# Patient Record
Sex: Male | Born: 1990 | Hispanic: No | Marital: Single | Smoking: Never smoker
Health system: Southern US, Community
[De-identification: ages and names within clinical notes are randomized; demographics above are authoritative.]

## PROBLEM LIST (undated history)

## (undated) DIAGNOSIS — S060X9A Concussion with loss of consciousness of unspecified duration, initial encounter: Secondary | ICD-10-CM

## (undated) DIAGNOSIS — Z789 Other specified health status: Secondary | ICD-10-CM

## (undated) HISTORY — PX: TONSILLECTOMY: SUR1361

## (undated) HISTORY — PX: EYE SURGERY: SHX253

---
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
## 2015-01-07 ENCOUNTER — Ambulatory Visit (INDEPENDENT_AMBULATORY_CARE_PROVIDER_SITE_OTHER): Payer: Self-pay | Admitting: Emergency Medicine

## 2015-01-07 VITALS — BP 100/60 | HR 120 | Temp 100.8°F | Resp 18 | Ht 71.0 in | Wt 180.0 lb

## 2015-01-07 DIAGNOSIS — B359 Dermatophytosis, unspecified: Secondary | ICD-10-CM

## 2015-01-07 DIAGNOSIS — J0140 Acute pansinusitis, unspecified: Secondary | ICD-10-CM

## 2015-01-07 DIAGNOSIS — J209 Acute bronchitis, unspecified: Secondary | ICD-10-CM

## 2015-01-07 MED ORDER — PSEUDOEPHEDRINE-GUAIFENESIN ER 60-600 MG PO TB12: 1.0000 | ORAL_TABLET | Freq: Two times a day (BID) | ORAL | Status: AC

## 2015-01-07 MED ORDER — CLOTRIMAZOLE 1 % EX CREA: 1.0000 "application " | TOPICAL_CREAM | Freq: Two times a day (BID) | CUTANEOUS | Status: DC

## 2015-01-07 MED ORDER — AMOXICILLIN-POT CLAVULANATE 875-125 MG PO TABS: 1.0000 | ORAL_TABLET | Freq: Two times a day (BID) | ORAL | Status: DC

## 2015-01-07 MED ORDER — PROMETHAZINE-CODEINE 6.25-10 MG/5ML PO SYRP: 5.0000 mL | ORAL_SOLUTION | Freq: Four times a day (QID) | ORAL | Status: DC | PRN

## 2015-01-07 NOTE — Patient Instructions (Signed)

## 2015-01-07 NOTE — Progress Notes (Signed)
Subjective:  Patient ID: Xavier Dorsey, male    DOB: 01/08/1991  Age: 24 y.o. MRN: [id number]  CC: Sore Throat; Headache; Cough; Fever; and Chills   HPI Xavier Dorsey presents  patient has nasal congestion postnasal drainage and nasal discharge is purulent. He has a headache and pressure in his cheeks and forehead. He has a sore throat fever. He has no chills. Has no nausea vomiting. Nonproductive cough with no wheezing or shortness of breath. He also noticed a rash on his right shoulder. He's had no improvement with over-the-counter medication he denies any sick contacts  History Xavier Dorsey has no past medical history on file.   He has past surgical history that includes Eye surgery and Tonsillectomy.   His  family history is not on file.  He   reports that he has never smoked. He does not have any smokeless tobacco history on file. He reports that he does not drink alcohol or use illicit drugs.  No outpatient prescriptions prior to visit.   No facility-administered medications prior to visit.    Social History   Social History  . Marital Status: Single    Spouse Name: N/A  . Number of Children: N/A  . Years of Education: N/A   Social History Main Topics  . Smoking status: Never Smoker   . Smokeless tobacco: None  . Alcohol Use: No  . Drug Use: No  . Sexual Activity: Not Asked   Other Topics Concern  . None   Social History Narrative  . None     Review of Systems  Constitutional: Positive for fever, chills and fatigue. Negative for appetite change.  HENT: Positive for congestion, postnasal drip, rhinorrhea, sinus pressure and sore throat. Negative for ear pain.   Eyes: Negative for pain and redness.  Respiratory: Positive for cough. Negative for shortness of breath and wheezing.   Cardiovascular: Negative for leg swelling.  Gastrointestinal: Negative for nausea, vomiting, abdominal pain, diarrhea, constipation and blood in stool.  Endocrine: Negative for  polyuria.  Genitourinary: Negative for dysuria, urgency, frequency and flank pain.  Musculoskeletal: Negative for gait problem.  Skin: Negative for rash.  Neurological: Negative for weakness and headaches.  Psychiatric/Behavioral: Negative for confusion and decreased concentration. The patient is not nervous/anxious.     Objective:  BP 100/60 mmHg  Pulse 120  Temp(Src) 100.8 F (38.2 C) (Oral)  Resp 18  Ht 5\' 11"  (1.803 m)  Wt 180 lb (81.647 kg)  BMI 25.12 kg/m2  SpO2 99%  Physical Exam  Constitutional: He is oriented to person, place, and time. He appears well-developed and well-nourished. No distress.  HENT:  Head: Normocephalic and atraumatic.  Right Ear: External ear normal.  Left Ear: External ear normal.  Nose: Nose normal.  Eyes: Conjunctivae and EOM are normal. Pupils are equal, round, and reactive to light. No scleral icterus.  Neck: Normal range of motion. Neck supple. No tracheal deviation present.  Cardiovascular: Normal rate, regular rhythm and normal heart sounds.   Pulmonary/Chest: Effort normal. No respiratory distress. He has no wheezes. He has no rales.  Abdominal: He exhibits no mass. There is no tenderness. There is no rebound and no guarding.  Musculoskeletal: He exhibits no edema.  Lymphadenopathy:    He has no cervical adenopathy.  Neurological: He is alert and oriented to person, place, and time. Coordination normal.  Skin: Skin is warm and dry. No rash noted.  Psychiatric: He has a normal mood and affect. His behavior is normal.  Assessment & Plan:   Xavier Dorsey was seen today for sore throat, headache, cough, fever and chills.  Diagnoses and all orders for this visit:  Acute bronchitis, unspecified organism  Acute pansinusitis, recurrence not specified  Ringworm  Other orders -     amoxicillin-clavulanate (AUGMENTIN) 875-125 MG tablet; Take 1 tablet by mouth 2 (two) times daily. -     pseudoephedrine-guaifenesin (MUCINEX D) 60-600 MG 12 hr  tablet; Take 1 tablet by mouth every 12 (twelve) hours. -     promethazine-codeine (PHENERGAN WITH CODEINE) 6.25-10 MG/5ML syrup; Take 5-10 mLs by mouth every 6 (six) hours as needed. -     clotrimazole (LOTRIMIN) 1 % cream; Apply 1 application topically 2 (two) times daily.  I am having Mr. Casamento start on amoxicillin-clavulanate, pseudoephedrine-guaifenesin, promethazine-codeine, and clotrimazole.  Meds ordered this encounter  Medications  . amoxicillin-clavulanate (AUGMENTIN) 875-125 MG tablet    Sig: Take 1 tablet by mouth 2 (two) times daily.    Dispense:  20 tablet    Refill:  0  . pseudoephedrine-guaifenesin (MUCINEX D) 60-600 MG 12 hr tablet    Sig: Take 1 tablet by mouth every 12 (twelve) hours.    Dispense:  18 tablet    Refill:  0  . promethazine-codeine (PHENERGAN WITH CODEINE) 6.25-10 MG/5ML syrup    Sig: Take 5-10 mLs by mouth every 6 (six) hours as needed.    Dispense:  120 mL    Refill:  0  . clotrimazole (LOTRIMIN) 1 % cream    Sig: Apply 1 application topically 2 (two) times daily.    Dispense:  30 g    Refill:  0    Appropriate red flag conditions were discussed with the patient as well as actions that should be taken.  Patient expressed his understanding.  Follow-up: Return if symptoms worsen or fail to improve.  Carmelina Dane, MD

## 2015-01-09 ENCOUNTER — Emergency Department (HOSPITAL_COMMUNITY)
Admission: EM | Admit: 2015-01-09 | Discharge: 2015-01-09 | Disposition: A | Discharge status: Home / Self Care | Payer: Self-pay | Attending: Emergency Medicine | Admitting: Emergency Medicine

## 2015-01-09 ENCOUNTER — Encounter (HOSPITAL_COMMUNITY): Payer: Self-pay | Admitting: *Deleted

## 2015-01-09 DIAGNOSIS — J029 Acute pharyngitis, unspecified: Secondary | ICD-10-CM

## 2015-01-09 DIAGNOSIS — K122 Cellulitis and abscess of mouth: Secondary | ICD-10-CM

## 2015-01-09 DIAGNOSIS — Z79899 Other long term (current) drug therapy: Secondary | ICD-10-CM | POA: Insufficient documentation

## 2015-01-09 DIAGNOSIS — Z9889 Other specified postprocedural states: Secondary | ICD-10-CM | POA: Insufficient documentation

## 2015-01-09 DIAGNOSIS — M791 Myalgia: Secondary | ICD-10-CM | POA: Insufficient documentation

## 2015-01-09 LAB — RAPID STREP SCREEN (MED CTR MEBANE ONLY): STREPTOCOCCUS, GROUP A SCREEN (DIRECT): NEGATIVE

## 2015-01-09 MED ORDER — DEXAMETHASONE SODIUM PHOSPHATE 10 MG/ML IJ SOLN
10.0000 mg | Freq: Once | INTRAMUSCULAR | Status: AC
  Administered 2015-01-09: 10 mg via INTRAMUSCULAR
  Filled 2015-01-09: qty 1

## 2015-01-09 MED ORDER — ACETAMINOPHEN 325 MG PO TABS
650.0000 mg | ORAL_TABLET | Freq: Once | ORAL | Status: AC
  Administered 2015-01-09: 650 mg via ORAL
  Filled 2015-01-09: qty 2

## 2015-01-09 MED ORDER — HYDROCODONE-ACETAMINOPHEN 7.5-325 MG/15ML PO SOLN: 15.0000 mL | Freq: Four times a day (QID) | ORAL | Status: AC | PRN

## 2015-01-09 NOTE — ED Provider Notes (Signed)
CSN: [id number]     Arrival date & time 01/09/15  1110 History  By signing my name below, I, Xavier Dorsey, attest that this documentation has been prepared under the direction and in the presence of non-physician practitioner, Jaynie Crumbleatyana Mairi Stagliano, PA-C. Electronically Signed: [name], Scribe. 01/09/2015. 12:20 PM.  Chief Complaint  Patient presents with  . Sore Throat   The history is provided by the patient. No language interpreter was used.    HPI Comments:  Xavier Dorsey is a 24 y.o. male who presents to the Emergency Department complaining of a  sore throat for ~ 4 days. He rates his pain a 6/10 and is tolerating secretions. Pt reports associated body aches, HA and fever. He reports a max temp of 103 4 days ago; no fever reducer taken. Pt states he was evaluated at urgent care 3 days ago and was given augmentin which he has been taking since. He was also given mucinex. Pt denies rhinorrhea and vomiting.   History reviewed. No pertinent past medical history. Past Surgical History  Procedure Laterality Date  . Eye surgery    . Tonsillectomy     History reviewed. No pertinent family history. Social History  Substance Use Topics  . Smoking status: Never Smoker   . Smokeless tobacco: None  . Alcohol Use: No    Review of Systems  Constitutional: Positive for fever. Negative for chills.  HENT: Positive for sore throat.   Respiratory: Negative for shortness of breath.   Cardiovascular: Negative for chest pain.  Musculoskeletal: Positive for myalgias.    Allergies  Review of patient's allergies indicates no known allergies.  Home Medications   Prior to Admission medications   Medication Sig Start Date End Date Taking? Authorizing Provider  amoxicillin-clavulanate (AUGMENTIN) 875-125 MG tablet Take 1 tablet by mouth 2 (two) times daily. 01/07/15   Carmelina DaneJeffery S Anderson, MD  clotrimazole (LOTRIMIN) 1 % cream Apply 1 application topically 2 (two) times daily. 01/07/15   Carmelina DaneJeffery  S Anderson, MD  promethazine-codeine (PHENERGAN WITH CODEINE) 6.25-10 MG/5ML syrup Take 5-10 mLs by mouth every 6 (six) hours as needed. 01/07/15   Carmelina DaneJeffery S Anderson, MD  pseudoephedrine-guaifenesin (MUCINEX D) 60-600 MG 12 hr tablet Take 1 tablet by mouth every 12 (twelve) hours. 01/07/15 01/07/16  Carmelina DaneJeffery S Anderson, MD   BP 112/58 mmHg  Pulse 104  Temp(Src) 98.6 F (37 C) (Oral)  Resp 16  SpO2 92% Physical Exam  Constitutional: He is oriented to person, place, and time. He appears well-developed and well-nourished. No distress.  HENT:  Head: Normocephalic and atraumatic.  Right Ear: Tympanic membrane, external ear and ear canal normal.  Left Ear: Tympanic membrane, external ear and ear canal normal.  Nose: Nose normal.  Mouth/Throat: Oropharyngeal exudate and posterior oropharyngeal erythema present.  Eyes: Conjunctivae are normal.  Cardiovascular: Normal rate, regular rhythm and normal heart sounds.   Pulmonary/Chest: Effort normal and breath sounds normal. No respiratory distress. He has no wheezes. He has no rales.  Abdominal: He exhibits no distension.  Neurological: He is alert and oriented to person, place, and time.  Skin: Skin is warm and dry.  Psychiatric: He has a normal mood and affect.  Nursing note and vitals reviewed.   ED Course  Procedures   DIAGNOSTIC STUDIES:  Oxygen Saturation is 99% on RA, normal by my interpretation.    COORDINATION OF CARE:  12:19 PM Will order rapid strep test.  Discussed treatment plan with pt at bedside and pt agreed to plan.  Labs Review Labs Reviewed  RAPID STREP SCREEN (NOT AT [facility])  CULTURE, GROUP A STREP    Imaging Review No results found. I have personally reviewed and evaluated these lab results as part of my medical decision-making.   MDM   Final diagnoses:  Pharyngitis  Uvulitis    patient symptoms emergency department complaining of fever, sore throat, body aches. Patient was seen 2 days ago, started on  Augmentin. He is currently taking that. Has prescription for 10 days. We ran a strep screen, which came back negative. Patient does have exudative pharyngitis and uvulitis. Given Decadron 10 mg IM for swelling. Home with salt water gargles, continue Augmentin, ibuprofen and Tylenol for pain. Will give Lortab for severe pain since patient states is very painful to swallow and feels like he could be dehydrated and at times reported dizziness. Instructed to drink plain fluids. Follow-up as needed  Filed Vitals:   01/09/15 1122  BP: 112/58  Pulse: 104  Temp: 98.6 F (37 C)  TempSrc: Oral  Resp: 16  SpO2: 92%    I personally performed the services described in this documentation, which was scribed in my presence. The recorded information has been reviewed and is accurate.   Jaynie Crumble, PA-C 01/09/15 1706  Gerhard Munch, MD 01/10/15 1209

## 2015-01-09 NOTE — Discharge Instructions (Signed)
Ibuprofen for pain and fever. Hycet for severe pain only. Drink plenty of fluids. Continue to take antibiotics, take until all gone. Salt water gargles several times a day. Follow up with urgent care or return if worsening symptoms.   Pharyngitis Pharyngitis is redness, pain, and swelling (inflammation) of your pharynx.  CAUSES  Pharyngitis is usually caused by infection. Most of the time, these infections are from viruses (viral) and are part of a cold. However, sometimes pharyngitis is caused by bacteria (bacterial). Pharyngitis can also be caused by allergies. Viral pharyngitis may be spread from person to person by coughing, sneezing, and personal items or utensils (cups, forks, spoons, toothbrushes). Bacterial pharyngitis may be spread from person to person by more intimate contact, such as kissing.  SIGNS AND SYMPTOMS  Symptoms of pharyngitis include:   Sore throat.   Tiredness (fatigue).   Low-grade fever.   Headache.  Joint pain and muscle aches.  Skin rashes.  Swollen lymph nodes.  Plaque-like film on throat or tonsils (often seen with bacterial pharyngitis). DIAGNOSIS  Your health care provider will ask you questions about your illness and your symptoms. Your medical history, along with a physical exam, is often all that is needed to diagnose pharyngitis. Sometimes, a rapid strep test is done. Other lab tests may also be done, depending on the suspected cause.  TREATMENT  Viral pharyngitis will usually get better in 3-4 days without the use of medicine. Bacterial pharyngitis is treated with medicines that kill germs (antibiotics).  HOME CARE INSTRUCTIONS   Drink enough water and fluids to keep your urine clear or pale yellow.   Only take over-the-counter or prescription medicines as directed by your health care provider:   If you are prescribed antibiotics, make sure you finish them even if you start to feel better.   Do not take aspirin.   Get lots of rest.    Gargle with 8 oz of salt water ( tsp of salt per 1 qt of water) as often as every 1-2 hours to soothe your throat.   Throat lozenges (if you are not at risk for choking) or sprays may be used to soothe your throat. SEEK MEDICAL CARE IF:   You have large, tender lumps in your neck.  You have a rash.  You cough up green, yellow-brown, or bloody spit. SEEK IMMEDIATE MEDICAL CARE IF:   Your neck becomes stiff.  You drool or are unable to swallow liquids.  You vomit or are unable to keep medicines or liquids down.  You have severe pain that does not go away with the use of recommended medicines.  You have trouble breathing (not caused by a stuffy nose). MAKE SURE YOU:   Understand these instructions.  Will watch your condition.  Will get help right away if you are not doing well or get worse.   This information is not intended to replace advice given to you by your health care provider. Make sure you discuss any questions you have with your health care provider.   Document Released: 01/31/2005 Document Revised: 11/21/2012 Document Reviewed: 10/08/2012 Else[employer] Interactive Patient Education [employer]2016 [employer]

## 2015-01-09 NOTE — ED Notes (Signed)
Pt c/o sore throat and fever x 4 days, also sts gen.body aches.

## 2015-01-11 LAB — CULTURE, GROUP A STREP: STREP A CULTURE: NEGATIVE

## 2015-01-14 ENCOUNTER — Emergency Department (HOSPITAL_COMMUNITY)
Admission: EM | Admit: 2015-01-14 | Discharge: 2015-01-14 | Disposition: A | Discharge status: Home / Self Care | Payer: Self-pay | Attending: Emergency Medicine | Admitting: Emergency Medicine

## 2015-01-14 ENCOUNTER — Encounter (HOSPITAL_COMMUNITY): Payer: Self-pay

## 2015-01-14 DIAGNOSIS — R531 Weakness: Secondary | ICD-10-CM | POA: Insufficient documentation

## 2015-01-14 DIAGNOSIS — Z792 Long term (current) use of antibiotics: Secondary | ICD-10-CM | POA: Insufficient documentation

## 2015-01-14 DIAGNOSIS — R630 Anorexia: Secondary | ICD-10-CM | POA: Insufficient documentation

## 2015-01-14 DIAGNOSIS — D696 Thrombocytopenia, unspecified: Secondary | ICD-10-CM | POA: Insufficient documentation

## 2015-01-14 DIAGNOSIS — R110 Nausea: Secondary | ICD-10-CM | POA: Insufficient documentation

## 2015-01-14 DIAGNOSIS — R197 Diarrhea, unspecified: Secondary | ICD-10-CM | POA: Insufficient documentation

## 2015-01-14 DIAGNOSIS — Z79899 Other long term (current) drug therapy: Secondary | ICD-10-CM | POA: Insufficient documentation

## 2015-01-14 LAB — BASIC METABOLIC PANEL
Anion gap: 7 (ref 5–15)
BUN: 11 mg/dL (ref 6–20)
CHLORIDE: 105 mmol/L (ref 101–111)
CO2: 24 mmol/L (ref 22–32)
Calcium: 9 mg/dL (ref 8.9–10.3)
Creatinine, Ser: 0.62 mg/dL (ref 0.61–1.24)
GFR calc Af Amer: >60 mL/min (ref >60)
GLUCOSE: 114 mg/dL — AB (ref 65–99)
POTASSIUM: 4.8 mmol/L (ref 3.5–5.1)
Sodium: 136 mmol/L (ref 135–145)

## 2015-01-14 LAB — CBC WITH DIFFERENTIAL/PLATELET
BASOS PCT: 4 %
Basophils Absolute: 0.1 10*3/uL (ref 0.0–0.1)
EOS PCT: 0 %
Eosinophils Absolute: 0 10*3/uL (ref 0.0–0.7)
HCT: 44.8 % (ref 39.0–52.0)
Hemoglobin: 15.8 g/dL (ref 13.0–17.0)
Lymphocytes Relative: 22 %
Lymphs Abs: 0.8 10*3/uL (ref 0.7–4.0)
MCH: 29.8 pg (ref 26.0–34.0)
MCHC: 35.3 g/dL (ref 30.0–36.0)
MCV: 84.4 fL (ref 78.0–100.0)
MONO ABS: 0.5 10*3/uL (ref 0.1–1.0)
Monocytes Relative: 14 %
NEUTROS PCT: 60 %
Neutro Abs: 2.2 10*3/uL (ref 1.7–7.7)
PLATELETS: 98 10*3/uL — AB (ref 150–400)
RBC: 5.31 MIL/uL (ref 4.22–5.81)
RDW: 12.4 % (ref 11.5–15.5)
WBC: 3.6 10*3/uL — ABNORMAL LOW (ref 4.0–10.5)

## 2015-01-14 MED ORDER — ONDANSETRON HCL 4 MG PO TABS: 4.0000 mg | ORAL_TABLET | Freq: Four times a day (QID) | ORAL | Status: DC

## 2015-01-14 MED ORDER — SODIUM CHLORIDE 0.9 % IV BOLUS (SEPSIS)
1000.0000 mL | Freq: Once | INTRAVENOUS | Status: AC
  Administered 2015-01-14: 1000 mL via INTRAVENOUS

## 2015-01-14 MED ORDER — ONDANSETRON HCL 4 MG/2ML IJ SOLN
4.0000 mg | Freq: Once | INTRAMUSCULAR | Status: AC
  Administered 2015-01-14: 4 mg via INTRAVENOUS
  Filled 2015-01-14: qty 2

## 2015-01-14 NOTE — Discharge Instructions (Signed)
Diarrhea Follow up with a provider using the resource guide below. Take Zofran for nausea. Stay well-hydrated. Diarrhea is watery poop (stool). It can make you feel weak, tired, thirsty, or give you a dry mouth (signs of dehydration). Watery poop is a sign of another problem, most often an infection. It often lasts 2-3 days. It can last longer if it is a sign of something serious. Take care of yourself as told by your doctor. HOME CARE   Drink 1 cup (8 ounces) of fluid each time you have watery poop.  Do not drink the following fluids:  Those that contain simple sugars (fructose, glucose, galactose, lactose, sucrose, maltose).  Sports drinks.  Fruit juices.  Whole milk products.  Sodas.  Drinks with caffeine (coffee, tea, soda) or alcohol.  Oral rehydration solution may be used if the doctor says it is okay. You may make your own solution. Follow this recipe:   - teaspoon table salt.   teaspoon baking soda.   teaspoon salt substitute containing potassium chloride.  1 tablespoons sugar.  1 liter (34 ounces) of water.  Avoid the following foods:  High fiber foods, such as raw fruits and vegetables.  Nuts, seeds, and whole grain breads and cereals.   Those that are sweetened with sugar alcohols (xylitol, sorbitol, mannitol).  Try eating the following foods:  Starchy foods, such as rice, toast, pasta, low-sugar cereal, oatmeal, baked potatoes, crackers, and bagels.  Bananas.  Applesauce.  Eat probiotic-rich foods, such as yogurt and milk products that are fermented.  Wash your hands well after each time you have watery poop.  Only take medicine as told by your doctor.  Take a warm bath to help lessen burning or pain from having watery poop. GET HELP RIGHT AWAY IF:   You cannot drink fluids without throwing up (vomiting).  You keep throwing up.  You have blood in your poop, or your poop looks black and tarry.  You do not pee (urinate) in 6-8 hours, or there  is only a small amount of very dark pee.  You have belly (abdominal) pain that gets worse or stays in the same spot (localizes).  You are weak, dizzy, confused, or light-headed.  You have a very bad headache.  Your watery poop gets worse or does not get better.  You have a fever or lasting symptoms for more than 2-3 days.  You have a fever and your symptoms suddenly get worse. MAKE SURE YOU:   Understand these instructions.  Will watch your condition.  Will get help right away if you are not doing well or get worse.   This information is not intended to replace advice given to you by your health care provider. Make sure you discuss any questions you have with your health care provider.   Document Released: 07/20/2007 Document Revised: 02/21/2014 Document Reviewed: 10/09/2011 Elsevier Interactive Patient Education 2016 [employer].  Emergency Department Resource Guide 1) Find a Doctor and Pay Out of Pocket Although you won't have to find out who is covered by your insurance plan, it is a good idea to ask around and get recommendations. You will then need to call the office and see if the doctor you have chosen will accept you as a new patient and what types of options they offer for patients who are self-pay. Some doctors offer discounts or will set up payment plans for their patients who do not have insurance, but you will need to ask so you aren't surprised when you  get to your appointment.  2) Contact Your Local Health Department Not all health departments have doctors that can see patients for sick visits, but many do, so it is worth a call to see if yours does. If you don't know where your local health department is, you can check in your phone book. The CDC also has a tool to help you locate your state's health department, and many state websites also have listings of all of their local health departments.  3) Find a Walk-in Clinic If your illness is not likely to be very  severe or complicated, you may want to try a walk in clinic. These are popping up all over the country in pharmacies, drugstores, and shopping centers. They're usually staffed by nurse practitioners or physician assistants that have been trained to treat common illnesses and complaints. They're usually fairly quick and inexpensive. However, if you have serious medical issues or chronic medical problems, these are probably not your best option.  No Primary Care Doctor: - Call Health Connect at  [phone] - they can help you locate a primary care doctor that  accepts your insurance, provides certain services, etc. - Physician Referral Service- [phone]  Chronic Pain Problems: Organization         Address  Phone   Notes  [facility]  [phone] Patients need to be referred by their primary care doctor.   Medication Assistance: Organization         Address  Phone   Notes  [facility] [facility] [address] [place], [place] [address] [phone]) [phone] --Must be a resident of [place] -- Must have NO insurance coverage whatsoever (no Medicaid/ Medicare, etc.) -- The pt. MUST have a primary care doctor that directs their care regularly and follows them in the community   [facility]ist  [phone]   [employer]  [phone]) [phone]    Agencies that provide inexpensive medical care: Organization         Address  Phone   Notes  [facility]  [phone]   [facility]    [phone]   [facility] [address] [place], [place] [address] [phone]   [facility] [address], [place] [phone]   Planned Parenthood    [phone]   [facility]    [phone](336) [phone]   [facility]  [address], [place] Phone:  [phone], Fax:  [phone] Hours of Operation:  9 am - 6 pm, M-F.  Also accepts  Medicaid/Medicare and self-pay.  [facility]  [address], [place] Phone: [phone](336) [phone], Fax: [phone]) [phone]. Hours of Operation:  8:30 am - 5:30 pm, M-F.  Also accepts Medicaid and self-pay.  [facility] [place] [address], [place] Phone: [phone]) [phone]   [facility] [address], [place], [place] [phone], Ext. [phone] Mondays & Thursdays: 7-9 AM.  First 15 patients are seen on a first come, first serve basis.    Medicaid-accepting [facility] Providers:  Organization         Address  Phone   Notes  [facility] [address], [place] [phone] Also accepts self-pay patients.  [facility] [address] [place], [place]  [phone]) [phone]   [facility]  [address], [place] [phone]   [facility] [address] [place] [phone]   Renaye Rakers [address], [place]   [phone] Only accepts [place] Access [place] patients after they have their name applied to their card.   Self-Pay (no insurance) in [facility]:  Organization         Address  Phone   Notes  Sickle Cell Patients, [facility] [address] [place], [place] [phone]   [facility] [address] [place], [place] [phone]   [facility] [place]  [address], [place] [phone]   [facility]/Dr. Osei-Bonsu  [address] [place] or [address], [place] [phone] Phone number for both [place] and [place] locations is the same.  [facility] [address], [place] [phone]   [facility] [address] [place] or [address] [phone]   [facility] [address] [place] [phone], phone; [phone], fax Sees patients 1st and 3rd Saturday of every month.  Must not qualify for public or private insurance (i.e. Medicaid, Medicare, [place] Health Choice, Veterans' Benefits)  Household income should be no more than 200% of the poverty level The clinic cannot treat you if you are pregnant or think you are pregnant  Sexually transmitted diseases are not treated at the clinic.    Dental Care: Organization         Address  Phone  Notes  [facility] [address] [place], [place] [phone] Accepts children up to age 72 who are enrolled in [place] or [place] Health Choice; pregnant women with a Medicaid card; and children who have applied for Medicaid or [place] Health Choice, but were declined, whose parents can pay a reduced fee at time of service.  [facility]  [address], [place] [phone] Accepts children up to age 71 who are enrolled in [place] or [place] Health Choice; pregnant women with a Medicaid card; and children who have applied for Medicaid or [place] Health Choice, but were declined, whose parents can pay a reduced fee at time of service.  [facility]  [address] [place], [place] [phone] Patients are seen by appointment only. Walk-ins are not accepted. [facility] will see patients 31 years of age and older. Monday - Tuesday (8am-5pm) Most Wednesdays (8:30-5pm) $30 per visit, cash only  [facility] Access [facility]  [address], [facility] [phone] Patients are seen by appointment only. Walk-ins are not accepted. [facility] will see patients 43 years of age and older. One Wednesday Evening (Monthly: Volunteer Based).  $30 per visit, cash only  [employer]  [phone] for adults; Children under age 48, call Graduate Pediatric Dentistry at [phone]. Children aged  61-14, please call [phone] to request a pediatric application.  Dental services are provided in all areas of dental care including fillings, crowns and bridges, complete and partial dentures, implants, gum treatment, root canals, and extractions. Preventive care is also provided. Treatment is provided to both adults and children. Patients are selected via a lottery and there is often a waiting list.   [facility] [address] [place]  [phone] [email or web address]   [facility] [address] [place], [place] [phone], Ext. [phone] Second and Fourth Thursday of each month, opens at 6:30 AM; Clinic ends at 9 AM.  Patients are seen on a first-come first-served basis, and a limited number are seen during each clinic.   [facility]  [address] [place] [place], [place] [phone]   Eligibility Requirements You must have lived in [place], [place], or [place] counties for at least the last three months.   You cannot be eligible for state or federal sponsored [place], including [employer], [place], or [employer].   You generally cannot be eligible for healthcare insurance through your employer.    How to apply: Eligibility screenings are held every Tuesday and Wednesday afternoon from 1:00 pm until 4:00 pm. You do not need an appointment for the interview!  [facility] [address] [place], [place] [phone]   [facility] Health Department  [phone]   [facility] Health Department  [phone]   [facility] Health Department  [phone]    Behavioral Health Resources in the Community: Intensive Outpatient Programs Organization         Address  Phone  Notes  [facility] [address], [place], [place] [phone]   [facility] [address] [place], [place] [phone]   [facility] [address],  [place], [place]  [phone]   [facility] Mental Health [address]  [place], [place] [phone] or [phone]   Substance Abuse Resources Organization         Address  Phone  Notes  [facility] Drug [facility]  [phone]   [facility]  [phone]   [facility] [place]  [phone]   Floydene Flock  [phone]   Residential & Outpatient Substance Abuse Program  [phone]   Psychological Services Organization         Address  Phone  Notes  [facility] Behavioral Health  [phone]   [facility] Services  [phone]   [facility] [address], [place] [phone] or [phone]    Mobile Crisis Teams Organization         Address  Phone  Notes  [facility], Mobile Crisis Care Unit  [phone]   [facility]  [address] [place], [place] [phone]   Doristine Locks [address] [place] [phone]    Self-Help/Support Groups Organization         Address  Phone             Notes  [facility] [place] - variety of support groups  [phone]- [phone] Call for more information  Narcotics Anonymous (NA), [facility] [address], [employer] [place]  2 meetings at this location   Statistician         Address  Phone  Notes  [facility] [address],    [place]  [phone]   [facility]  [address], [place] [address], [place], [place] [phone]   [facility] [address] [place], [place] [phone] Admissions: 8am-3pm M-F  [facility] [address],    [place], [place] [phone]   [facility] [address] [place], [place] [phone]   [facility] [address],  [place],  [place] [phone]   [facility] - Intensive Outpatient [address], [place], [place] [phone]   [facility]  (Addiction Recovery [facility] [address] [place]  [place], [place] [phone] or [phone]   [facility] ([facility]) [address], [place], [place] [phone] Accepts Medicaid  [facility] [address],  [place] [phone] Substance Abuse/Addiction Treatment   [facility] Organization         Address  Phone  Notes  [facility]  [phone]   Angie Fava, PhD [address] Knack [place], [place]   [phone] or [phone]   [facility]   [address] [place], [place] [phone]   [facility] [address] [place], [place] [phone] Insurance/Medicaid/sponsorship through [facility] [address]                                    [place], [place] [phone] Therapy/tele-psych/case  [facility] [address], [place] [phone]    Dr. Lolly Mustache  [phone]   [facility]  [employer] [facility] Dept. 1) [address], [place] 2) [address], [place] 3)  [address], [place] [phone]  [phone]   [facility] Child Abuse Hotline (340)724-9553 or [phone] (After Hours)

## 2015-01-14 NOTE — ED Provider Notes (Signed)
CSN: [id number]     Arrival date & time 01/14/15  6213 History   First MD Initiated Contact with Patient 01/14/15 1008     Chief Complaint  Patient presents with  . Diarrhea  . Nausea   (Consider location/radiation/quality/duration/timing/severity/associated sxs/prior Treatment) Patient is a 24 y.o. male presenting with diarrhea. The history is provided by the patient. No language interpreter was used.  Diarrhea Associated symptoms: no abdominal pain, no chills, no fever and no vomiting    Xavier Dorsey is a 24 year old male with no significant past medical history who presents complaining of loss of appetite 6 days when he had a sore throat. He is also complaining of constant nausea, weakness, and 4 episodes of diarrhea but without blood since yesterday. He states his sore throat and fever resolved. [facility] for this and told it was viral. No treatment prior to arrival. He is in no pain now. He denies any fever, chills, chest pain, shortness of breath, cough, abdominal pain, vomiting, hematochezia, constipation, or urinary symptoms.    History reviewed. No pertinent past medical history. Past Surgical History  Procedure Laterality Date  . Eye surgery    . Tonsillectomy     History reviewed. No pertinent family history. Social History  Substance Use Topics  . Smoking status: Never Smoker   . Smokeless tobacco: None  . Alcohol Use: No    Review of Systems  Constitutional: Negative for fever and chills.  Gastrointestinal: Positive for nausea and diarrhea. Negative for vomiting, abdominal pain and blood in stool.  All other systems reviewed and are negative.     Allergies  Review of patient's allergies indicates no known allergies.  Home Medications   Prior to Admission medications   Medication Sig Start Date End Date Taking? Authorizing Provider  amoxicillin-clavulanate (AUGMENTIN) 875-125 MG tablet Take 1 tablet by mouth 2 (two) times daily. 01/07/15   Yes Carmelina Dane, MD  clotrimazole (LOTRIMIN) 1 % cream Apply 1 application topically 2 (two) times daily. 01/07/15  Yes Carmelina Dane, MD  HYDROcodone-acetaminophen (HYCET) 7.5-325 mg/15 ml solution Take 15 mLs by mouth 4 (four) times daily as needed for moderate pain. 01/09/15 01/09/16 Yes Tatyana Kirichenko, PA-C  hydrocortisone 2.5 % cream Apply 1 application topically daily as needed. 06/26/14 06/26/15 Yes Historical Provider, MD  ketoconazole (NIZORAL) 2 % shampoo Apply 1 application topically 2 (two) times a week.   Yes Historical Provider, MD  promethazine-codeine (PHENERGAN WITH CODEINE) 6.25-10 MG/5ML syrup Take 5-10 mLs by mouth every 6 (six) hours as needed. 01/07/15  Yes Carmelina Dane, MD  pseudoephedrine-[facility]enes[facility] D) 60-600 MG 12 hr tablet Take 1 tablet by mouth every 12 (twelve) hours. 01/07/15 01/07/16 Yes Carmelina Dane, MD  ondansetron (ZOFRAN) 4 MG tablet Take 1 tablet (4 mg total) by mouth every 6 (six) hours. 01/14/15   Xavier Whitebread Patel-Mills, PA-C   BP 129/74 mmHg  Pulse 74  Temp(Src) 97.7 F (36.5 C) (Oral)  Resp 15  SpO2 100% Physical Exam  Constitutional: He is oriented to person, place, and time. He appears well-developed and well-nourished. No distress.  HENT:  Head: Normocephalic and atraumatic.  Eyes: Conjunctivae are normal.  Neck: Normal range of motion. Neck supple.  Cardiovascular: Normal rate, regular rhythm and normal heart sounds.   Pulmonary/Chest: Effort normal and breath sounds normal. No respiratory distress. He has no decreased breath sounds. He has no wheezes. He has no rales.  Lungs clear to auscultation bilaterally. Heart: Regular rate  and rhythm, no murmurs.  Abdominal: Soft. There is no tenderness.  No abdominal tenderness to palpation. No guarding or rebound. No abdominal distention. Normal appearance.  No hepatosplenomegaly.  Musculoskeletal: Normal range of motion.  Neurological: He is alert and oriented to person,  place, and time.  Skin: Skin is warm and dry.  No skin tenting or pallor.  Psychiatric: He has a normal mood and affect.  Nursing note and vitals reviewed.   ED Course  Procedures (including critical care time) Labs Review Labs Reviewed  CBC WITH DIFFERENTIAL/PLATELET - Abnormal; Notable for the following:    WBC 3.6 (*)    Platelets 98 (*)    All other components within normal limits  BASIC METABOLIC PANEL - Abnormal; Notable for the following:    Glucose, Bld 114 (*)    All other components within normal limits    Imaging Review No results found. I have personally reviewed and evaluated these lab results as part of my medical decision-making.   EKG Interpretation None      MDM   Final diagnoses:  Diarrhea, unspecified type  Nausea  Thrombocytopenia (HCC)  Patient presents for decreased appetite, nausea, and weakness for the past couple of days. He is afebrile well-appearing. He denies being in any pain. Basic labs were obtained and patient has thrombocytopenia and leukopenia but I do not believe this is causing his symptoms. He has no bruising or bleeding. I discussed return precautions with the patient as well as follow-up. He verbally agrees with the plan. He was given a prescription for Zofran. Medications  sodium chloride 0.9 % bolus 1,000 mL (0 mLs Intravenous Stopped 01/14/15 1132)  ondansetron (ZOFRAN) injection 4 mg (4 mg Intravenous Given 01/14/15 1134)        Catha GosselinHanna Patel-Mills, PA-C 01/15/15 1121  Tilden FossaElizabeth Rees, MD 01/15/15 1222

## 2015-01-14 NOTE — ED Notes (Signed)
Pt c/o diarrhea and lack of appetite x 6 days and nausea x 1 day.  Denies pain.  [facility] which he sts has resolved.

## 2015-01-14 NOTE — ED Notes (Signed)
PT DISCHARGED. INSTRUCTIONS AND PRESCRIPTION GIVEN. AAOX3. PT IN NO APPARENT DISTRESS OR PAIN. THE OPPORTUNITY TO ASK QUESTIONS WAS PROVIDED. 

## 2015-01-28 ENCOUNTER — Ambulatory Visit (INDEPENDENT_AMBULATORY_CARE_PROVIDER_SITE_OTHER): Payer: Worker's Compensation | Admitting: Family Medicine

## 2015-01-28 ENCOUNTER — Encounter: Payer: Self-pay | Admitting: Family Medicine

## 2015-01-28 VITALS — BP 120/60 | HR 99 | Temp 98.6°F | Resp 20 | Ht 71.0 in | Wt 171.4 lb

## 2015-01-28 DIAGNOSIS — R599 Enlarged lymph nodes, unspecified: Secondary | ICD-10-CM

## 2015-01-28 DIAGNOSIS — S0990XA Unspecified injury of head, initial encounter: Secondary | ICD-10-CM

## 2015-01-28 DIAGNOSIS — B2700 Gammaherpesviral mononucleosis without complication: Secondary | ICD-10-CM

## 2015-01-28 DIAGNOSIS — R590 Localized enlarged lymph nodes: Secondary | ICD-10-CM

## 2015-01-28 DIAGNOSIS — Y990 Civilian activity done for income or pay: Secondary | ICD-10-CM

## 2015-01-28 DIAGNOSIS — L219 Seborrheic dermatitis, unspecified: Secondary | ICD-10-CM

## 2015-01-28 DIAGNOSIS — R591 Generalized enlarged lymph nodes: Secondary | ICD-10-CM

## 2015-01-28 LAB — POCT CBC
Granulocyte percent: 20.6 %G — AB (ref 37–80)
HCT, POC: 40.9 % — AB (ref 43.5–53.7)
HEMOGLOBIN: 14.3 g/dL (ref 14.1–18.1)
LYMPH, POC: 4.8 — AB (ref 0.6–3.4)
MCH, POC: 29.3 pg (ref 27–31.2)
MCHC: 34.8 g/dL (ref 31.8–35.4)
MCV: 84.3 fL (ref 80–97)
MID (cbc): 1.4 — AB (ref 0–0.9)
MPV: 8.3 fL (ref 0–99.8)
PLATELET COUNT, POC: 158 10*3/uL (ref 142–424)
POC Granulocyte: 1.6 — AB (ref 2–6.9)
POC LYMPH %: 62 % — AB (ref 10–50)
POC MID %: 17.4 %M — AB (ref 0–12)
RBC: 4.86 M/uL (ref 4.69–6.13)
RDW, POC: 13 %
WBC: 7.8 10*3/uL (ref 4.6–10.2)

## 2015-01-28 NOTE — Progress Notes (Signed)
Subjective:    Patient ID: Xavier Dorsey, male    DOB: 03-Jul-1990, 24 y.o.   MRN: [id number]  01/28/2015  No chief complaint on file.   HPI This 24 y.o. male presents for evaluation of swelling/knots on scalp. Onset today and painful.  Three weeks ago, suffered with acute illness.  Had fever Tmax to 103.  +HA today only; had mild head trauma at work two days ago; no headache until this morning.  +ST three weeks ago and has resolved.  No ear pain.  No rhinorrhea or nasal congestion; was treated three weeks ago for sinusitis and had been suffering with thick yellow nasal drainage at that time that has completely resolved.  Sinus pressure has resolved. Cough had resolved.  Diarrhea has resolved.  Rash on R shoulder has resolved as well. No n/v.  Denies dizziness, blurred vision.  +rash on face is chronic; has been evaluated by dermatology in the past; rx for Ketoconazole 2% shampoo and hydrocortisone 2.5% ointment and advised to use regularly.  No weight loss yet appetite is still down.  Patient evaluated at [facility] 01/07/15; then evaluated at ED 01/09/15; then returned to ED 01/14/15.  [facility]; diagnosed with mononucleosis.     Review of Systems  Constitutional: Positive for appetite change and fatigue. Negative for fever, chills, diaphoresis and activity change.  HENT: Negative for congestion, dental problem, ear discharge, ear pain, facial swelling, hearing loss, mouth sores, nosebleeds, postnasal drip, rhinorrhea, sinus pressure, sneezing, sore throat, trouble swallowing and voice change.   Eyes: Negative for photophobia and visual disturbance.  Respiratory: Negative for cough and shortness of breath.   Cardiovascular: Negative for chest pain, palpitations and leg swelling.  Gastrointestinal: Negative for nausea, vomiting, abdominal pain and diarrhea.  Endocrine: Negative for cold intolerance, heat intolerance, polydipsia, polyphagia and polyuria.  Skin:  Positive for rash.  Neurological: Positive for headaches. Negative for dizziness, tremors, seizures, syncope, facial asymmetry, speech difficulty, weakness, light-headedness and numbness.  Hematological: Positive for adenopathy. Does not bruise/bleed easily.    No past medical history on file. Past Surgical History  Procedure Laterality Date  . Eye surgery    . Tonsillectomy    . Tonsillectomy     No Known Allergies Current Outpatient Prescriptions  Medication Sig Dispense Refill  . amoxicillin-clavulanate (AUGMENTIN) 875-125 MG tablet Take 1 tablet by mouth 2 (two) times daily. (Patient not taking: Reported on 01/28/2015) 20 tablet 0  . clotrimazole (LOTRIMIN) 1 % cream Apply 1 application topically 2 (two) times daily. 30 g 0  . HYDROcodone-acetaminophen (HYCET) 7.5-325 mg/15 ml solution Take 15 mLs by mouth 4 (four) times daily as needed for moderate pain. 120 mL 0  . hydrocortisone 2.5 % cream Apply 1 application topically daily as needed.    Marland Kitchen ketoconazole (NIZORAL) 2 % shampoo Apply 1 application topically 2 (two) times a week.    . ondansetron (ZOFRAN) 4 MG tablet Take 1 tablet (4 mg total) by mouth every 6 (six) hours. (Patient not taking: Reported on 01/28/2015) 12 tablet 0  . promethazine-codeine (PHENERGAN WITH CODEINE) 6.25-10 MG/5ML syrup Take 5-10 mLs by mouth every 6 (six) hours as needed. (Patient not taking: Reported on 01/28/2015) 120 mL 0  . pseudoephedrine-guaifenesin (MUCINEX D) 60-600 MG 12 hr tablet Take 1 tablet by mouth every 12 (twelve) hours. (Patient not taking: Reported on 01/28/2015) 18 tablet 0   No current facility-administered medications for this visit.   Social History   Social  History  . Marital Status: Single    Spouse Name: N/A  . Number of Children: N/A  . Years of Education: N/A   Occupational History  . Not on file.   Social History Main Topics  . Smoking status: Never Smoker   . Smokeless tobacco: Not on file  . Alcohol Use: No  .  Drug Use: No  . Sexual Activity: Not on file   Other Topics Concern  . Not on file   Social History Narrative   Marital status: single; dating seriously; females.      Children: none      Lives: with mother      Em[facility] [employer] [facility] [employer]      Tobacco: none      Alcohol: none      Drugs: none   No family history on file.     Objective:    There were no vitals taken for this visit. Physical Exam  Constitutional: He is oriented to person, place, and time. He appears well-developed and well-nourished. No distress.  HENT:  Head: Normocephalic and atraumatic.  Right Ear: External ear normal.  Left Ear: External ear normal.  Nose: Nose normal.  Mouth/Throat: Oropharynx is clear and moist.  Eyes: Conjunctivae and EOM are normal. Pupils are equal, round, and reactive to light.  Neck: Normal range of motion. Neck supple. Carotid bruit is not present. No thyromegaly present.  Cardiovascular: Normal rate, regular rhythm, normal heart sounds and intact distal pulses.  Exam reveals no gallop and no friction rub.   No murmur heard. Pulmonary/Chest: Effort normal and breath sounds normal. He has no wheezes. He has no rales.  Abdominal: Soft. Bowel sounds are normal.  Lymphadenopathy:    He has no cervical adenopathy.  Neurological: He is alert and oriented to person, place, and time. No cranial nerve deficit. He exhibits normal muscle tone. Coordination normal.  Skin: Skin is warm and dry. Rash noted. He is not diaphoretic.  Scaling rash along brow and nasolabial folds B.  Psychiatric: He has a normal mood and affect. His behavior is normal.  Nursing note and vitals reviewed.  Results for orders placed or performed in visit on 01/28/15  POCT CBC  Result Value Ref Range   WBC 7.8 4.6 - 10.2 K/uL   Lymph, poc 4.8 (A) 0.6 - 3.4   POC LYMPH PERCENT 62.0 (A) 10 - 50 %L   MID (cbc) 1.4 (A) 0 - 0.9   POC MID % 17.4 (A) 0 - 12 %M   POC Granulocyte 1.6 (A) 2 -  6.9   Granulocyte percent 20.6 (A) 37 - 80 %G   RBC 4.86 4.69 - 6.13 M/uL   Hemoglobin 14.3 14.1 - 18.1 g/dL   HCT, POC 16.140.9 (A) 09.643.5 - 53.7 %   MCV 84.3 80 - 97 fL   MCH, POC 29.3 27 - 31.2 pg   MCHC 34.8 31.8 - 35.4 g/dL   RDW, POC 04.513.0 %   Platelet Count, POC 158.0 142 - 424 K/uL   MPV 8.3 0 - 99.8 fL       Assessment & Plan:   1. Lymphadenopathy, postauricular   2. LAD (lymphadenopathy), posterior cervical   3. Dermatitis, seborrheic   4. Gammaherpesviral mononucleosis without complication    1.  Postauricular LAD:  New.  Secondary to seborrhea and mono acute. 2.  LAD posterior cervical:  New. Secondary to acute mononucleosis. 3.  Seborrhea dermatitis: uncontrolled; recommend Ketoconazole 2% shampoo twice  weekly.  Recommend increasing hydrocortisone to bid for two weeks.  4.  Acute mononucleosis:  New. Supportive care provided; educated on duration of illness and associated symptoms.      No orders of the defined types were placed in this encounter.   No orders of the defined types were placed in this encounter.    No Follow-up on file.    Kennidi Yoshida Paulita Fujita, M.D. [facility] [address] [place], [place]  [address] [phone] phone [phone] fax

## 2015-01-28 NOTE — Patient Instructions (Signed)
Traumatic Brain Injury Traumatic brain injury (TBI) is an injury to your brain from a blow to your head (closed injury) or an object penetrating your skull and entering your brain (open injury). The severity of TBI varies significantly from one person to the next. Some TBIs cause you to pass out (lose consciousness) immediately and for a long period of time. Other TBIs do not cause any loss of consciousness.  Symptoms of any type of TBI can be long lasting (chronic). TBI can interfere with memory and speech. TBI can also cause chronic symptoms like headache or dizziness. CAUSES  TBI is caused by a closed or open injury. RISK FACTORS You may be at higher risk for TBI if you:  Are 75 or older.  Are a man.  Are in a car accident.  Play a contact sport, especially football, hockey, or soccer.  Do not wear protective gear while playing sports.  Are in the military.  Are a victim of violence.  Abuse drugs or alcohol.  Have had a previous TBI. SIGNS AND SYMPTOMS  Signs and symptoms of TBI may occur right away or not until days, weeks, or months after the injury. They may last for days, weeks, months, or years. Symptoms may include:  Loss of consciousness.  Headache.  Confusion.  Fatigue.  Changes in sleep.  Dizziness.  Mood or personality changes.  Memory problems.  Nausea or vomiting or both.  Seizures.  Clumsiness.  Slurred speech.  Depression and anxiety.  Anger.  Inability to control emotions or actions (impulse control).  Loss of or dulling of your senses, such as hearing, vision, and touch. This can include:  Blurred vision.  Ringing in your ears. DIAGNOSIS  TBI may be diagnosed by a medical history and physical exam. Your health care provider will also do a neurologic exam to check your:  Reflexes.  Sensations.  Alertness.  Memory.  Vision.  Hearing.  Coordination. Your health care provider will also do tests to diagnose the extent of  your TBI, such as a CT scan of your brain and skull. One way to determine the severity of your TBI is with a scoring system called the Glasgow Coma Scale (GCS). It measures eye opening, motor response, and verbal response. The higher the score, the milder the TBI.  Your TBI may be described as mild, moderate, or severe:   Mild TBI (concussion).  Symptoms of mild TBI usually go away on their own. This can take weeks or months, depending on the type of concussion.  Your GCS will be 13-15.  Your brain CT scan will be normal.  You may or may not have a short hospital stay.  Moderate TBI.  Your GCS will be 9-12.  Your brain CT scan will be abnormal.  You will likely need a short hospital stay.  Severe TBI.  Your GCS will be 3-8.  Your brain CT scan will be abnormal.  You may need a long stay in the hospital. TREATMENT Emergency treatment of TBI may involve measures to maintain a clear airway and stable blood pressure. Brain surgery may be needed to:  Remove a blood clot.  Repair bleeding.  Remove an object that has penetrated the brain, such as a skull fragment or a bullet. Other treatments depend on your chronic signs and symptoms. These treatments include the following types of therapy:  Physical.  Occupational.  Speech and language.  Mental health.  Social support. HOME CARE INSTRUCTIONS  Carefully follow all your health care   provider's instructions.  Work closely with all your therapists, if necessary.   Take medicines only as directed by your health care provider. Do not take aspirin or other anti-inflammatory medicines such as ibuprofen or naproxen unless approved by your health care provider.  Do not abuse illegal drugs.   Limit alcohol intake to no more than 1 drink per day for nonpregnant women and 2 drinks per day for men. One drink equals 12 ounces of beer, 5 ounces of wine, or 1 ounces of hard liquor.  Avoid any situation where there is potential  for another head injury, such as football, hockey, soccer, basketball, martial arts, downhill snow sports, and horseback riding. Do not do these activities until your health care provider approves.   Rest. Rest helps the brain to heal. Make sure you:   Get plenty of sleep at night. Avoid staying up late at night.   Keep the same bedtime hours on weekends and weekdays.   Rest during the day. Take daytime naps or rest breaks when you feel tired.  Avoid excessive visual stimulation while recovering from a TBI. This includes work on the computer, watching TV, and reading.  Try to avoid activities that cause physical or mental stress. Stay home from work or school as directed by your health care provider.  Make lists to plan your day and help your memory.  Do not drive, ride a bicycle, or operate heavy machinery until your health care provider approves.  Seek support from friends and family.  Keep all follow-up visits as directed by your health care provider. This is important.  Watch your symptoms and tell others to do the same. Complications sometimes occur after a TBI. PREVENTION   Wear a helmet while biking, skiing, skateboarding, skating, or doing similar activities. Wear your seatbelt while driving.  Do not abuse alcohol or drugs.  Do not drink and drive.  Prevent falls at home by:  Removing clutter and tripping hazards, including loose rugs, from floors and stairways.  Using grab bars in bathrooms and handrails by stairs.   Placing nonslip mats on floors and in bathtubs.   Improving lighting in dim areas.  SEEK MEDICAL CARE IF: Seek medical care if you have any of the following symptoms for more than two weeks after your injury:  Chronic headaches.   Dizziness or balance problems.   Nausea.   Vision problems.   Increased sensitivity to noise or light.   Depression or mood swings.   Anxiety or irritability.   Memory problems.   Difficulty  concentrating or paying attention.   Sleep problems.   Feeling tired all the time.  SEEK IMMEDIATE MEDICAL CARE IF:  You have confusion or unusual drowsiness.  It is difficult to wake you up.   You have nausea or persistent, forceful vomiting.   You feel like you are moving when you are not (vertigo). Your eyes may move rapidly back and forth.  You have convulsions or faint.   You have severe, persistent headaches that are not relieved by medicine.   You cannot use your arms or legs normally.   One of your pupils is larger than the other.   You have clear or bloody discharge from your nose or ears.  Your problems are getting worse, not better.    This information is not intended to replace advice given to you by your health care provider. Make sure you discuss any questions you have with your health care provider.   Document Released:   01/21/2002 Document Revised: 02/21/2014 Document Reviewed: 05/16/2013 Elsevier Interactive Patient Education 2016 Elsevier Inc.  
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
## 2015-05-25 ENCOUNTER — Encounter (HOSPITAL_COMMUNITY): Payer: Self-pay | Admitting: Emergency Medicine

## 2015-05-25 ENCOUNTER — Emergency Department (HOSPITAL_COMMUNITY)
Admission: RE | Admit: 2015-05-25 | Discharge: 2015-05-25 | Disposition: A | Discharge status: Home / Self Care | Payer: 59 | Attending: Emergency Medicine | Admitting: Emergency Medicine

## 2015-05-25 ENCOUNTER — Emergency Department (HOSPITAL_COMMUNITY): Payer: 59

## 2015-05-25 DIAGNOSIS — L03316 Cellulitis of umbilicus: Secondary | ICD-10-CM

## 2015-05-25 DIAGNOSIS — R1909 Other intra-abdominal and pelvic swelling, mass and lump: Secondary | ICD-10-CM | POA: Diagnosis present

## 2015-05-25 DIAGNOSIS — Z79899 Other long term (current) drug therapy: Secondary | ICD-10-CM | POA: Insufficient documentation

## 2015-05-25 LAB — URINALYSIS, ROUTINE W REFLEX MICROSCOPIC
Bilirubin Urine: NEGATIVE
GLUCOSE, UA: NEGATIVE mg/dL
Hgb urine dipstick: NEGATIVE
Ketones, ur: NEGATIVE mg/dL
LEUKOCYTES UA: NEGATIVE
Nitrite: NEGATIVE
PROTEIN: NEGATIVE mg/dL
SPECIFIC GRAVITY, URINE: 1.017 (ref 1.005–1.030)
pH: 7.5 (ref 5.0–8.0)

## 2015-05-25 LAB — CBC
HEMATOCRIT: 43.5 % (ref 39.0–52.0)
HEMOGLOBIN: 15.3 g/dL (ref 13.0–17.0)
MCH: 32.1 pg (ref 26.0–34.0)
MCHC: 35.2 g/dL (ref 30.0–36.0)
MCV: 91.2 fL (ref 78.0–100.0)
PLATELETS: 171 10*3/uL (ref 150–400)
RBC: 4.77 MIL/uL (ref 4.22–5.81)
RDW: 12.7 % (ref 11.5–15.5)
WBC: 7.2 10*3/uL (ref 4.0–10.5)

## 2015-05-25 LAB — COMPREHENSIVE METABOLIC PANEL
ALT: 40 U/L (ref 17–63)
ANION GAP: 7 (ref 5–15)
AST: 27 U/L (ref 15–41)
Albumin: 3.8 g/dL (ref 3.5–5.0)
Alkaline Phosphatase: 60 U/L (ref 38–126)
BUN: 10 mg/dL (ref 6–20)
CHLORIDE: 103 mmol/L (ref 101–111)
CO2: 27 mmol/L (ref 22–32)
CREATININE: 0.64 mg/dL (ref 0.61–1.24)
Calcium: 8.9 mg/dL (ref 8.9–10.3)
Glucose, Bld: 93 mg/dL (ref 65–99)
POTASSIUM: 4.5 mmol/L (ref 3.5–5.1)
SODIUM: 137 mmol/L (ref 135–145)
Total Bilirubin: 0.7 mg/dL (ref 0.3–1.2)
Total Protein: 7.7 g/dL (ref 6.5–8.1)

## 2015-05-25 LAB — LIPASE, BLOOD: Lipase: 21 U/L (ref 11–51)

## 2015-05-25 MED ORDER — ACETAMINOPHEN 325 MG PO TABS
650.0000 mg | ORAL_TABLET | Freq: Once | ORAL | Status: AC
  Administered 2015-05-25: 650 mg via ORAL
  Filled 2015-05-25: qty 2

## 2015-05-25 MED ORDER — CEPHALEXIN 500 MG PO CAPS: 500.0000 mg | ORAL_CAPSULE | Freq: Four times a day (QID) | ORAL | Status: DC

## 2015-05-25 MED ORDER — IOPAMIDOL (ISOVUE-300) INJECTION 61%
100.0000 mL | Freq: Once | INTRAVENOUS | Status: AC | PRN
  Administered 2015-05-25: 100 mL via INTRAVENOUS

## 2015-05-25 NOTE — Discharge Instructions (Signed)
Cellulitis Cellulitis is an infection of the skin and the tissue beneath it. The infected area is usually red and tender. Cellulitis occurs most often in the arms and lower legs.  CAUSES  Cellulitis is caused by bacteria that enter the skin through cracks or cuts in the skin. The most common types of bacteria that cause cellulitis are staphylococci and streptococci. SIGNS AND SYMPTOMS   Redness and warmth.  Swelling.  Tenderness or pain.  Fever. DIAGNOSIS  Your health care provider can usually determine what is wrong based on a physical exam. Blood tests may also be done. TREATMENT  Treatment usually involves taking an antibiotic medicine. HOME CARE INSTRUCTIONS   Take your antibiotic medicine as directed by your health care provider. Finish the antibiotic even if you start to feel better.  Keep the infected arm or leg elevated to reduce swelling.  Apply a warm cloth to the affected area up to 4 times per day to relieve pain.  Take medicines only as directed by your health care provider.  Keep all follow-up visits as directed by your health care provider. SEEK MEDICAL CARE IF:   You notice red streaks coming from the infected area.  Your red area gets larger or turns dark in color.  Your bone or joint underneath the infected area becomes painful after the skin has healed.  Your infection returns in the same area or another area.  You notice a swollen bump in the infected area.  You develop new symptoms.  You have a fever. SEEK IMMEDIATE MEDICAL CARE IF:   You feel very sleepy.  You develop vomiting or diarrhea.  You have a general ill feeling (malaise) with muscle aches and pains.   This information is not intended to replace advice given to you by your health care provider. Make sure you discuss any questions you have with your health care provider.   Document Released: 11/10/2004 Document Revised: 10/22/2014 Document Reviewed: 04/18/2011 Elsevier Interactive  Patient Education 2016 [employer] Take medications as prescribed. Return to the emergency room for worsening condition or new concerning symptoms. Follow up with your regular doctor. If you don't have a regular doctor use one of the numbers below to establish a primary care doctor.   Emergency Department Resource Guide 1) Find a Doctor and Pay Out of Pocket Although you won't have to find out who is covered by your insurance plan, it is a good idea to ask around and get recommendations. You will then need to call the office and see if the doctor you have chosen will accept you as a new patient and what types of options they offer for patients who are self-pay. Some doctors offer discounts or will set up payment plans for their patients who do not have insurance, but you will need to ask so you aren't surprised when you get to your appointment.  2) Contact Your Local Health Department Not all health departments have doctors that can see patients for sick visits, but many do, so it is worth a call to see if yours does. If you don't know where your local health department is, you can check in your phone book. The CDC also has a tool to help you locate your state's health department, and many state websites also have listings of all of their local health departments.  3) Find a Walk-in Clinic If your illness is not likely to be very severe or complicated, you may want to try a walk in clinic. These are  popping up all over the country in pharmacies, drugstores, and shopping centers. They're usually staffed by nurse practitioners or physician assistants that have been trained to treat common illnesses and complaints. They're usually fairly quick and inexpensive. However, if you have serious medical issues or chronic medical problems, these are probably not your best option.  No Primary Care Doctor: - Call Health Connect at  [phone] - they can help you locate a primary care doctor that  accepts your  insurance, provides certain services, etc. - Physician Referral [phone]  Emergency Department Resource Guide 1) Find a Doctor and Pay Out of Pocket Although you won't have to find out who is covered by your insurance plan, it is a good idea to ask around and get recommendations. You will then need to call the office and see if the doctor you have chosen will accept you as a new patient and what types of options they offer for patients who are self-pay. Some doctors offer discounts or will set up payment plans for their patients who do not have insurance, but you will need to ask so you aren't surprised when you get to your appointment.  2) Contact Your Local Health Department Not all health departments have doctors that can see patients for sick visits, but many do, so it is worth a call to see if yours does. If you don't know where your local health department is, you can check in your phone book. The CDC also has a tool to help you locate your state's health department, and many state websites also have listings of all of their local health departments.  3) Find a Walk-in Clinic If your illness is not likely to be very severe or complicated, you may want to try a walk in clinic. These are popping up all over the country in pharmacies, drugstores, and shopping centers. They're usually staffed by nurse practitioners or physician assistants that have been trained to treat common illnesses and complaints. They're usually fairly quick and inexpensive. However, if you have serious medical issues or chronic medical problems, these are probably not your best option.  No Primary Care Doctor: - Call Health Connect at  [phone] - they can help you locate a primary care doctor that  accepts your insurance, provides certain services, etc. - Physician Referral Service- [phone]  Chronic Pain Problems: Organization         Address  Phone   Notes  [facility]  [phone] Patients need to be referred by their primary care doctor.   Medication Assistance: Organization         Address  Phone   Notes  [facility] Medication [facility] [address] [place] [place], [place] [address] [phone] --Must be a resident of [facility] -- Must have NO insurance coverage whatsoever (no Medicaid/ Medicare, etc.) -- The pt. MUST have a primary care doctor that directs their care regularly and follows them in the community   [facility]  [phone]   [employer]  [phone]    Agencies that provide inexpensive medical care: Organization         Address  Phone   Notes  [facility]  [phone]   [facility]    [phone]   [facility] [address] [place], [place] [address] [phone]   [facility] [address] [place] [place] [phone]   [facility]anned [facility]    (  [phone]   [facility]    [phone]   [facility] and [facility]  [address], [place] Phone:  [phone], Fax:  [phone] Hours of Operation:  9 am - 6 pm, M-F.  Also accepts Medicaid/Medicare and self-pay.  [facility]  [address], [place] Phone: [phone], Fax: [phone]. Hours of Operation:  8:30 am - 5:30 pm, M-F.  Also accepts Medicaid and self-pay.  [facility] [place] [address] [place] Phone: [phone]   [facility] [address] Natasha Bence [place], [place] [phone], Ext. [phone] Mondays & Thursdays: 7-9 AM.  First 15 patients are seen on a first come, first serve basis.    Medicaid-accepting [facility] Providers:  Organization         Address  Phone   Notes  [facility] [address], [place] [phone] Also accepts self-pay patients.  [facility] [address] Laurell Josephs [place], [place]   [phone]   [facility] [address], [place] [phone]   [facility] [address] [place] [phone]   Renaye Rakers [address], [place]   [phone] Only accepts [place] patients after they have their name applied to their card.   Self-Pay (no insurance) in [facility]:  Organization         Address  Phone   Notes  Sickle Cell Patients, [facility] [address] [place], [place] [phone]   [facility] [address] [place], [place] [phone]   [facility] [place]  [address], [place] [phone]   [facility]/Dr. Osei-Bonsu  [address] [place] or [address], [place] [phone] Phone number for both [place] and [place] locations is the same.  [facility] [address], [place] [phone]   [facility] [address], [place] or [address] [phone]   [facility] [address], [place] [phone], phone; [phone], fax Sees patients 1st and 3rd Saturday of every month.  Must not qualify for public or private insurance (i.e. Medicaid, Medicare, [place] Health Choice, Veterans' Benefits)  Household income should be no more than 200% of the poverty level The clinic cannot treat you if you are pregnant or think you are pregnant  Sexually transmitted diseases are not treated at the clinic.

## 2015-05-25 NOTE — ED Notes (Signed)
Pt presents with swelling & redness directly  to the left of umbilicus.  Pain is present with bending over, lifting, coughing.

## 2015-05-25 NOTE — ED Provider Notes (Signed)
CSN: [id number]     Arrival date & time 05/25/15  1317 History  By signing my name below, I, Placido SouLogan Joldersma, attest that this documentation has been prepared under the direction and in the presence of Rhylee Nunn Y Olon Russ, [place]C. Electronically Signed: Placido SouLogan Joldersma, ED Scribe. 05/25/2015. 3:19 PM.   Chief Complaint  Patient presents with  . Hernia   The history is provided by the patient. No language interpreter was used.    HPI Comments: Xavier Dorsey is a 25 y.o. male who is otherwise healthy presents to the Emergency Department complaining of a point of swelling to the left of his umbilicus x 1 day. He reports coughing yesterday and noticed the swelling which he describes as "hard" further noting that it is present constantly and isn't alleviated when applying pressure. Pt notes associated, 5/10, pain in the region which presents with palpation or when bending over but denies pain at rest. Pt denies fevers, chills or other associated symptoms at this time.    History reviewed. No pertinent past medical history. Past Surgical History  Procedure Laterality Date  . Eye surgery    . Tonsillectomy    . Tonsillectomy     History reviewed. No pertinent family history. Social History  Substance Use Topics  . Smoking status: Never Smoker   . Smokeless tobacco: None  . Alcohol Use: No    Review of Systems A complete 10 system review of systems was obtained and all systems are negative except as noted in the HPI and PMH.   Allergies  Review of patient's allergies indicates no known allergies.  Home Medications   Prior to Admission medications   Medication Sig Start Date End Date Taking? Authorizing Provider  amoxicillin-clavulanate (AUGMENTIN) 875-125 MG tablet Take 1 tablet by mouth 2 (two) times daily. Patient not taking: Reported on 01/28/2015 01/07/15   Carmelina DaneJeffery S Anderson, MD  clotrimazole (LOTRIMIN) 1 % cream Apply 1 application topically 2 (two) times daily. 01/07/15   Carmelina DaneJeffery S  Anderson, MD  HYDROcodone-acetaminophen (HYCET) 7.5-325 mg/15 ml solution Take 15 mLs by mouth 4 (four) times daily as needed for moderate pain. 01/09/15 01/09/16  Tatyana Kirichenko, PA-C  hydrocortisone 2.5 % cream Apply 1 application topically daily as needed. 06/26/14 06/26/15  Historical Provider, MD  ketoconazole (NIZORAL) 2 % shampoo Apply 1 application topically 2 (two) times a week.    Historical Provider, MD  ondansetron (ZOFRAN) 4 MG tablet Take 1 tablet (4 mg total) by mouth every 6 (six) hours. Patient not taking: Reported on 01/28/2015 01/14/15   Catha GosselinHanna Patel-Mills, PA-C  promethazine-codeine (PHENERGAN WITH CODEINE) 6.25-10 MG/5ML syrup Take 5-10 mLs by mouth every 6 (six) hours as needed. Patient not taking: Reported on 01/28/2015 01/07/15   Carmelina DaneJeffery S Anderson, MD  pseudoephedrine-guaifenesin [facility](MUCINEX D) 60-600 MG 12 hr tablet Take 1 tablet by mouth every 12 (twelve) hours. Patient not taking: Reported on 01/28/2015 01/07/15 01/07/16  Carmelina DaneJeffery S Anderson, MD   BP 149/86 mmHg  Pulse 96  Temp(Src) 98.2 F (36.8 C) (Oral)  Resp 16  Ht 5\' 10"  (1.778 m)  Wt 180 lb (81.647 kg)  BMI 25.83 kg/m2  SpO2 99% Physical Exam  Constitutional: He is oriented to person, place, and time. He appears well-developed and well-nourished.  HENT:  Head: Normocephalic and atraumatic.  Eyes: EOM are normal.  Neck: Normal range of motion.  Cardiovascular: Normal rate.   Pulmonary/Chest: Effort normal. No respiratory distress.  Abdominal: Soft. Bowel sounds are normal.  2cm hard palpable mass at  left lateral area of umbilical region. Tender to palpation. Cannot reduce. Mild overlying erythema.   Musculoskeletal: Normal range of motion.  Neurological: He is alert and oriented to person, place, and time.  Skin: Skin is warm and dry.  Psychiatric: He has a normal mood and affect.  Nursing note and vitals reviewed.  ED Course  Procedures  DIAGNOSTIC STUDIES: Oxygen Saturation is 99% on RA, normal  by my interpretation.    COORDINATION OF CARE: 3:17 PM Discussed next steps with pt. He verbalized understanding and is agreeable with the plan.   Labs Review Labs Reviewed  LIPASE, BLOOD  COMPREHENSIVE METABOLIC PANEL  CBC  URINALYSIS, ROUTINE W REFLEX MICROSCOPIC (NOT AT [facility])    Imaging Review Ct Abdomen Pelvis W Contrast  05/25/2015  CLINICAL DATA:  Swelling left of the umbilicus for 1 day. Evaluate for possible hernia. EXAM: CT ABDOMEN AND PELVIS WITH CONTRAST TECHNIQUE: Multidetector CT imaging of the abdomen and pelvis was performed using the standard protocol following bolus administration of intravenous contrast. CONTRAST:  ISOVUE-300 IOPAMIDOL (ISOVUE-300) INJECTION 61% COMPARISON:  None. FINDINGS: Lower chest and abdominal wall: Skin thickening and subcutaneous reticulation centered left of the umbilicus. There is subtle central low-density without fluid collection or abscess. No associated hernia. No soft tissue gas. Hepatobiliary: No focal liver abnormality.No evidence of biliary obstruction or stone. Pancreas: Unremarkable. Spleen: Unremarkable. Adrenals/Urinary Tract: Negative adrenals. No hydronephrosis or stone. Unremarkable bladder. Reproductive:Negative. Stomach/Bowel:  No obstruction. No appendicitis. Vascular/Lymphatic: No acute vascular abnormality. No mass or adenopathy. Peritoneal: No ascites or pneumoperitoneum. Musculoskeletal: No acute abnormalities. IMPRESSION: Cellulitic changes around the umbilicus. No abscess or soft tissue gas. Electronically Signed   By: Marnee Spring M.D.   On: 05/25/2015 16:41   I have personally reviewed and evaluated these images and lab results as part of my medical decision-making.   EKG Interpretation None      MDM   Final diagnoses:  Cellulitis of umbilicus    Labs obtained in triage unremarkable. Pt is afebrile. There does appear to be some mild surrounding erythema over the area that could be cellulitic in nature. Will  obtain CT to r/o incarcerated hernia, abscess, or other acute intra-abdominal process.   CT reveals no abscess, hernia. There are cellulitic changes around the umbilicus. Pt Rx given for keflex. Instructed to f/u with PCP for follow up. ER return precautions given.   I personally performed the services described in this documentation, which was scribed in my presence. The recorded information has been reviewed and is accurate.   Carlene Coria, PA-C 05/28/15 1250  Laurence Spates, MD 05/29/15 [phone]

## 2016-04-13 ENCOUNTER — Ambulatory Visit (INDEPENDENT_AMBULATORY_CARE_PROVIDER_SITE_OTHER): Payer: Self-pay | Admitting: Physician Assistant

## 2016-04-13 VITALS — BP 138/82 | HR 69 | Temp 97.5°F | Resp 16 | Ht 70.0 in | Wt 160.0 lb

## 2016-04-13 DIAGNOSIS — Z0000 Encounter for general adult medical examination without abnormal findings: Secondary | ICD-10-CM
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
## 2016-04-13 NOTE — Patient Instructions (Signed)
     IF you received an x-ray today, you will receive an invoice from [facility]. Please contact  Radiology at [phone] with questions or concerns regarding your invoice.   IF you received labwork today, you will receive an invoice from [facility]. Please contact [facility] at [phone] with questions or concerns regarding your invoice.   Our billing staff will not be able to assist you with questions regarding bills from these companies.  You will be contacted with the lab results as soon as they are available. The fastest way to get your results is to activate your My Chart account. Instructions are located on the last page of this paperwork. If you have not heard from us regarding the results in 2 weeks, please contact this office.     

## 2016-04-14 ENCOUNTER — Encounter: Payer: Self-pay | Admitting: Family Medicine

## 2016-09-05 ENCOUNTER — Telehealth: Payer: Self-pay | Admitting: Physician Assistant

## 2016-09-05 NOTE — Telephone Encounter (Signed)
Pt came by with additional information needed for his [facility] physical you did back in February.  The forms have been left in your box.  Contact him at [phone] when finished and please fax to [phone].

## 2016-09-07 NOTE — Telephone Encounter (Signed)
Please advise 

## 2016-09-07 NOTE — Telephone Encounter (Signed)
He needs to come in for this. Please advise.  There was a form completed at last visit.  He needs to be followed up

## 2016-09-08 NOTE — Telephone Encounter (Signed)
Left message to schedule f/u appointment.

## 2016-09-12 ENCOUNTER — Encounter: Payer: Self-pay | Admitting: Physician Assistant

## 2016-09-12 ENCOUNTER — Ambulatory Visit (INDEPENDENT_AMBULATORY_CARE_PROVIDER_SITE_OTHER): Payer: 59 | Admitting: Physician Assistant

## 2016-09-12 VITALS — BP 127/77 | HR 71 | Temp 98.4°F | Resp 16 | Ht 70.0 in | Wt 170.0 lb

## 2016-09-12 DIAGNOSIS — R5383 Other fatigue: Secondary | ICD-10-CM | POA: Diagnosis not present
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
## 2016-09-12 NOTE — Patient Instructions (Signed)
     IF you received an x-ray today, you will receive an invoice from [facility]. Please contact [facility] at [phone] with questions or concerns regarding your invoice.   IF you received labwork today, you will receive an invoice from [facility]. Please contact [facility] at [phone] with questions or concerns regarding your invoice.   Our billing staff will not be able to assist you with questions regarding bills from these companies.  You will be contacted with the lab results as soon as they are available. The fastest way to get your results is to activate your My Chart account. Instructions are located on the last page of this paperwork. If you have not heard from us regarding the results in 2 weeks, please contact this office.     

## 2016-09-30 ENCOUNTER — Encounter (HOSPITAL_COMMUNITY)
Admission: EM | Disposition: A | Discharge status: Home / Self Care | Payer: Self-pay | Source: Home / Self Care | Attending: Orthopedic Surgery

## 2016-09-30 ENCOUNTER — Inpatient Hospital Stay (HOSPITAL_COMMUNITY): Payer: 59 | Admitting: Anesthesiology

## 2016-09-30 ENCOUNTER — Inpatient Hospital Stay (HOSPITAL_COMMUNITY)
Admission: EM | Admit: 2016-09-30 | Discharge: 2016-10-03 | DRG: 493 | Disposition: A | Discharge status: Home / Self Care | Payer: 59 | Attending: Orthopedic Surgery | Admitting: Orthopedic Surgery

## 2016-09-30 ENCOUNTER — Emergency Department (HOSPITAL_COMMUNITY): Payer: 59

## 2016-09-30 ENCOUNTER — Inpatient Hospital Stay (HOSPITAL_COMMUNITY): Payer: 59

## 2016-09-30 ENCOUNTER — Encounter (HOSPITAL_COMMUNITY): Payer: Self-pay | Admitting: Emergency Medicine

## 2016-09-30 DIAGNOSIS — S82252A Displaced comminuted fracture of shaft of left tibia, initial encounter for closed fracture: Principal | ICD-10-CM | POA: Diagnosis present

## 2016-09-30 DIAGNOSIS — S82232A Displaced oblique fracture of shaft of left tibia, initial encounter for closed fracture: Secondary | ICD-10-CM

## 2016-09-30 DIAGNOSIS — S82202A Unspecified fracture of shaft of left tibia, initial encounter for closed fracture: Secondary | ICD-10-CM | POA: Diagnosis present

## 2016-09-30 DIAGNOSIS — Y92410 Unspecified street and highway as the place of occurrence of the external cause: Secondary | ICD-10-CM

## 2016-09-30 DIAGNOSIS — S82402A Unspecified fracture of shaft of left fibula, initial encounter for closed fracture: Secondary | ICD-10-CM | POA: Diagnosis present

## 2016-09-30 DIAGNOSIS — R0602 Shortness of breath: Secondary | ICD-10-CM

## 2016-09-30 DIAGNOSIS — D62 Acute posthemorrhagic anemia: Secondary | ICD-10-CM | POA: Diagnosis not present

## 2016-09-30 DIAGNOSIS — F10129 Alcohol abuse with intoxication, unspecified: Secondary | ICD-10-CM | POA: Diagnosis present

## 2016-09-30 DIAGNOSIS — S060X9A Concussion with loss of consciousness of unspecified duration, initial encounter: Secondary | ICD-10-CM | POA: Diagnosis present

## 2016-09-30 DIAGNOSIS — J9811 Atelectasis: Secondary | ICD-10-CM | POA: Diagnosis not present

## 2016-09-30 DIAGNOSIS — M79662 Pain in left lower leg: Secondary | ICD-10-CM | POA: Diagnosis present

## 2016-09-30 DIAGNOSIS — S82872A Displaced pilon fracture of left tibia, initial encounter for closed fracture: Secondary | ICD-10-CM | POA: Diagnosis present

## 2016-09-30 DIAGNOSIS — F10929 Alcohol use, unspecified with intoxication, unspecified: Secondary | ICD-10-CM | POA: Diagnosis present

## 2016-09-30 DIAGNOSIS — S060XAA Concussion with loss of consciousness status unknown, initial encounter: Secondary | ICD-10-CM | POA: Diagnosis present

## 2016-09-30 DIAGNOSIS — T148XXA Other injury of unspecified body region, initial encounter: Secondary | ICD-10-CM

## 2016-09-30 DIAGNOSIS — Y906 Blood alcohol level of 120-199 mg/100 ml: Secondary | ICD-10-CM | POA: Diagnosis present

## 2016-09-30 DIAGNOSIS — Z419 Encounter for procedure for purposes other than remedying health state, unspecified: Secondary | ICD-10-CM

## 2016-09-30 HISTORY — DX: Other specified health status: Z78.9

## 2016-09-30 HISTORY — DX: Concussion with loss of consciousness of unspecified duration, initial encounter: S06.0X9A

## 2016-09-30 HISTORY — PX: TIBIA IM NAIL INSERTION: SHX2516

## 2016-09-30 HISTORY — DX: Concussion with loss of consciousness status unknown, initial encounter: S06.0XAA

## 2016-09-30 HISTORY — PX: IM NAILING TIBIA: SUR734

## 2016-09-30 HISTORY — PX: ORIF ANKLE FRACTURE: SHX5408

## 2016-09-30 LAB — CBC
HEMATOCRIT: 41.2 % (ref 39.0–52.0)
HEMATOCRIT: 35.8 % — AB (ref 39.0–52.0)
HEMOGLOBIN: 11.9 g/dL — AB (ref 13.0–17.0)
Hemoglobin: 14.2 g/dL (ref 13.0–17.0)
MCH: 29.1 pg (ref 26.0–34.0)
MCH: 28.4 pg (ref 26.0–34.0)
MCHC: 34.5 g/dL (ref 30.0–36.0)
MCHC: 33.2 g/dL (ref 30.0–36.0)
MCV: 84.4 fL (ref 78.0–100.0)
MCV: 85.4 fL (ref 78.0–100.0)
PLATELETS: 163 10*3/uL (ref 150–400)
Platelets: 141 10*3/uL — ABNORMAL LOW (ref 150–400)
RBC: 4.88 MIL/uL (ref 4.22–5.81)
RBC: 4.19 MIL/uL — AB (ref 4.22–5.81)
RDW: 12.6 % (ref 11.5–15.5)
RDW: 13 % (ref 11.5–15.5)
WBC: 7.3 10*3/uL (ref 4.0–10.5)
WBC: 6.6 10*3/uL (ref 4.0–10.5)

## 2016-09-30 LAB — CDS SEROLOGY

## 2016-09-30 LAB — COMPREHENSIVE METABOLIC PANEL
ALBUMIN: 3.6 g/dL (ref 3.5–5.0)
ALT: 48 U/L (ref 17–63)
AST: 32 U/L (ref 15–41)
Alkaline Phosphatase: 61 U/L (ref 38–126)
Anion gap: 6 (ref 5–15)
BILIRUBIN TOTAL: 0.8 mg/dL (ref 0.3–1.2)
BUN: 6 mg/dL (ref 6–20)
CHLORIDE: 105 mmol/L (ref 101–111)
CO2: 26 mmol/L (ref 22–32)
CREATININE: 0.74 mg/dL (ref 0.61–1.24)
Calcium: 8.2 mg/dL — ABNORMAL LOW (ref 8.9–10.3)
GFR calc Af Amer: >60 mL/min (ref >60)
GLUCOSE: 112 mg/dL — AB (ref 65–99)
POTASSIUM: 4.3 mmol/L (ref 3.5–5.1)
Sodium: 137 mmol/L (ref 135–145)
TOTAL PROTEIN: 7.9 g/dL (ref 6.5–8.1)

## 2016-09-30 LAB — TYPE AND SCREEN
ABO/RH(D): O POS
ABO/RH(D): O POS
ANTIBODY SCREEN: NEGATIVE
ANTIBODY SCREEN: NEGATIVE

## 2016-09-30 LAB — I-STAT CG4 LACTIC ACID, ED: Lactic Acid, Venous: 1.55 mmol/L (ref 0.5–1.9)

## 2016-09-30 LAB — PROTIME-INR
INR: 1.05
INR: 1.07
PROTHROMBIN TIME: 13.9 s (ref 11.4–15.2)
Prothrombin Time: 13.7 seconds (ref 11.4–15.2)

## 2016-09-30 LAB — I-STAT CHEM 8, ED
BUN: 7 mg/dL (ref 6–20)
CREATININE: 0.9 mg/dL (ref 0.61–1.24)
Calcium, Ion: 1.05 mmol/L — ABNORMAL LOW (ref 1.15–1.40)
Chloride: 103 mmol/L (ref 101–111)
Glucose, Bld: 112 mg/dL — ABNORMAL HIGH (ref 65–99)
HEMATOCRIT: 42 % (ref 39.0–52.0)
Hemoglobin: 14.3 g/dL (ref 13.0–17.0)
POTASSIUM: 4.5 mmol/L (ref 3.5–5.1)
SODIUM: 140 mmol/L (ref 135–145)
TCO2: 26 mmol/L (ref 0–100)

## 2016-09-30 LAB — URINALYSIS, ROUTINE W REFLEX MICROSCOPIC
BILIRUBIN URINE: NEGATIVE
Glucose, UA: NEGATIVE mg/dL
HGB URINE DIPSTICK: NEGATIVE
KETONES UR: NEGATIVE mg/dL
Leukocytes, UA: NEGATIVE
NITRITE: NEGATIVE
PH: 7 (ref 5.0–8.0)
Protein, ur: NEGATIVE mg/dL
SPECIFIC GRAVITY, URINE: 1.013 (ref 1.005–1.030)

## 2016-09-30 LAB — ABO/RH: ABO/RH(D): O POS

## 2016-09-30 LAB — MRSA PCR SCREENING: MRSA by PCR: NEGATIVE

## 2016-09-30 LAB — SAMPLE TO BLOOD BANK

## 2016-09-30 LAB — APTT: APTT: 27 s (ref 24–36)

## 2016-09-30 LAB — ETHANOL: Alcohol, Ethyl (B): 183 mg/dL — ABNORMAL HIGH (ref <5)

## 2016-09-30 SURGERY — INSERTION, INTRAMEDULLARY ROD, TIBIA
Anesthesia: General | Site: Ankle | Laterality: Left

## 2016-09-30 MED ORDER — OXYCODONE HCL 5 MG PO TABS: 5.0000 mg | ORAL_TABLET | Freq: Four times a day (QID) | ORAL | 0 refills | Status: AC | PRN

## 2016-09-30 MED ORDER — BISACODYL 5 MG PO TBEC
5.0000 mg | DELAYED_RELEASE_TABLET | Freq: Every day | ORAL | Status: DC | PRN
Start: 2016-09-30 — End: 2016-10-03

## 2016-09-30 MED ORDER — FENTANYL CITRATE (PF) 250 MCG/5ML IJ SOLN
INTRAMUSCULAR | Status: AC
  Filled 2016-09-30: qty 5

## 2016-09-30 MED ORDER — HYDROCODONE-ACETAMINOPHEN 7.5-325 MG PO TABS: 1.0000 | ORAL_TABLET | Freq: Four times a day (QID) | ORAL | 0 refills | Status: AC | PRN

## 2016-09-30 MED ORDER — POVIDONE-IODINE 10 % EX SWAB: 2.0000 "application " | Freq: Once | CUTANEOUS | Status: DC

## 2016-09-30 MED ORDER — METHOCARBAMOL 1000 MG/10ML IJ SOLN
1000.0000 mg | Freq: Four times a day (QID) | INTRAVENOUS | Status: DC
  Administered 2016-09-30: 1000 mg via INTRAVENOUS
  Filled 2016-09-30 (×6): qty 10

## 2016-09-30 MED ORDER — POLYETHYLENE GLYCOL 3350 17 G PO PACK
17.0000 g | PACK | Freq: Every day | ORAL | Status: DC
  Administered 2016-10-01 – 2016-10-03 (×3): 17 g via ORAL
  Filled 2016-09-30 (×3): qty 1

## 2016-09-30 MED ORDER — IOPAMIDOL (ISOVUE-300) INJECTION 61%
INTRAVENOUS | Status: AC
  Administered 2016-09-30: 100 mL
  Filled 2016-09-30: qty 100

## 2016-09-30 MED ORDER — ONDANSETRON HCL 4 MG/2ML IJ SOLN: 4.0000 mg | Freq: Four times a day (QID) | INTRAMUSCULAR | Status: DC | PRN

## 2016-09-30 MED ORDER — SUCCINYLCHOLINE CHLORIDE 200 MG/10ML IV SOSY
PREFILLED_SYRINGE | INTRAVENOUS | Status: DC | PRN
  Administered 2016-09-30: 100 mg via INTRAVENOUS

## 2016-09-30 MED ORDER — ONDANSETRON HCL 4 MG/2ML IJ SOLN
INTRAMUSCULAR | Status: AC
Start: 2016-09-30 — End: 2016-09-30
  Filled 2016-09-30: qty 2

## 2016-09-30 MED ORDER — ONDANSETRON HCL 4 MG/2ML IJ SOLN
INTRAMUSCULAR | Status: DC | PRN
  Administered 2016-09-30: 4 mg via INTRAVENOUS

## 2016-09-30 MED ORDER — HYDROMORPHONE HCL 1 MG/ML IJ SOLN
1.0000 mg | INTRAMUSCULAR | Status: DC | PRN
  Administered 2016-09-30: 1 mg via INTRAVENOUS
  Filled 2016-09-30: qty 1

## 2016-09-30 MED ORDER — OXYCODONE HCL 5 MG PO TABS
5.0000 mg | ORAL_TABLET | ORAL | Status: DC | PRN
  Administered 2016-10-01 (×2): 10 mg via ORAL
  Administered 2016-10-03: 5 mg via ORAL
  Filled 2016-09-30: qty 1
  Filled 2016-09-30 (×2): qty 2

## 2016-09-30 MED ORDER — LIDOCAINE 2% (20 MG/ML) 5 ML SYRINGE
INTRAMUSCULAR | Status: AC
  Filled 2016-09-30: qty 5

## 2016-09-30 MED ORDER — PROPOFOL 10 MG/ML IV BOLUS
INTRAVENOUS | Status: DC | PRN
  Administered 2016-09-30: 150 mg via INTRAVENOUS

## 2016-09-30 MED ORDER — DOCUSATE SODIUM 100 MG PO CAPS
100.0000 mg | ORAL_CAPSULE | Freq: Two times a day (BID) | ORAL | Status: DC
  Administered 2016-09-30 – 2016-10-03 (×6): 100 mg via ORAL
  Filled 2016-09-30 (×6): qty 1

## 2016-09-30 MED ORDER — METOCLOPRAMIDE HCL 5 MG/ML IJ SOLN: 5.0000 mg | Freq: Three times a day (TID) | INTRAMUSCULAR | Status: DC | PRN

## 2016-09-30 MED ORDER — CEFAZOLIN SODIUM-DEXTROSE 2-4 GM/100ML-% IV SOLN
2.0000 g | INTRAVENOUS | Status: AC
  Administered 2016-09-30: 2 g via INTRAVENOUS
  Filled 2016-09-30 (×2): qty 100

## 2016-09-30 MED ORDER — POTASSIUM CHLORIDE IN NACL 20-0.9 MEQ/L-% IV SOLN
INTRAVENOUS | Status: DC
  Administered 2016-09-30 – 2016-10-01 (×2): via INTRAVENOUS
  Filled 2016-09-30 (×2): qty 1000

## 2016-09-30 MED ORDER — MIDAZOLAM HCL 2 MG/2ML IJ SOLN
INTRAMUSCULAR | Status: AC
  Filled 2016-09-30: qty 2

## 2016-09-30 MED ORDER — HYDROCODONE-ACETAMINOPHEN 7.5-325 MG PO TABS: 1.0000 | ORAL_TABLET | Freq: Four times a day (QID) | ORAL | Status: DC | PRN

## 2016-09-30 MED ORDER — FENTANYL CITRATE (PF) 250 MCG/5ML IJ SOLN
INTRAMUSCULAR | Status: DC | PRN
  Administered 2016-09-30: 100 ug via INTRAVENOUS
  Administered 2016-09-30: 50 ug via INTRAVENOUS
  Administered 2016-09-30 (×2): 25 ug via INTRAVENOUS
  Administered 2016-09-30 (×3): 50 ug via INTRAVENOUS

## 2016-09-30 MED ORDER — ONDANSETRON HCL 4 MG PO TABS: 4.0000 mg | ORAL_TABLET | Freq: Three times a day (TID) | ORAL | 0 refills | Status: AC | PRN

## 2016-09-30 MED ORDER — METHOCARBAMOL 1000 MG/10ML IJ SOLN
1000.0000 mg | Freq: Four times a day (QID) | INTRAVENOUS | Status: DC | PRN
  Filled 2016-09-30: qty 10

## 2016-09-30 MED ORDER — SUGAMMADEX SODIUM 200 MG/2ML IV SOLN
INTRAVENOUS | Status: AC
  Filled 2016-09-30: qty 2

## 2016-09-30 MED ORDER — METHOCARBAMOL 500 MG PO TABS: 500.0000 mg | ORAL_TABLET | Freq: Four times a day (QID) | ORAL | Status: DC | PRN

## 2016-09-30 MED ORDER — ACETAMINOPHEN 325 MG PO TABS: 650.0000 mg | ORAL_TABLET | Freq: Four times a day (QID) | ORAL | Status: DC | PRN

## 2016-09-30 MED ORDER — PROMETHAZINE HCL 25 MG/ML IJ SOLN: 6.2500 mg | INTRAMUSCULAR | Status: DC | PRN

## 2016-09-30 MED ORDER — LACTATED RINGERS IV SOLN
INTRAVENOUS | Status: DC
  Administered 2016-09-30 (×2): via INTRAVENOUS

## 2016-09-30 MED ORDER — ENOXAPARIN SODIUM 40 MG/0.4ML ~~LOC~~ SOLN: 40.0000 mg | SUBCUTANEOUS | 0 refills | Status: AC

## 2016-09-30 MED ORDER — CHLORHEXIDINE GLUCONATE 4 % EX LIQD: 60.0000 mL | Freq: Once | CUTANEOUS | Status: DC

## 2016-09-30 MED ORDER — ROCURONIUM BROMIDE 10 MG/ML (PF) SYRINGE
PREFILLED_SYRINGE | INTRAVENOUS | Status: DC | PRN
  Administered 2016-09-30: 20 mg via INTRAVENOUS
  Administered 2016-09-30 (×3): 10 mg via INTRAVENOUS
  Administered 2016-09-30: 40 mg via INTRAVENOUS

## 2016-09-30 MED ORDER — HYDROMORPHONE HCL 1 MG/ML IJ SOLN
0.2500 mg | INTRAMUSCULAR | Status: DC | PRN
  Administered 2016-09-30 (×2): 0.5 mg via INTRAVENOUS

## 2016-09-30 MED ORDER — SUGAMMADEX SODIUM 200 MG/2ML IV SOLN
INTRAVENOUS | Status: DC | PRN
  Administered 2016-09-30: 200 mg via INTRAVENOUS

## 2016-09-30 MED ORDER — PHENYLEPHRINE 40 MCG/ML (10ML) SYRINGE FOR IV PUSH (FOR BLOOD PRESSURE SUPPORT)
PREFILLED_SYRINGE | INTRAVENOUS | Status: AC
  Filled 2016-09-30: qty 10

## 2016-09-30 MED ORDER — ONDANSETRON HCL 4 MG PO TABS: 4.0000 mg | ORAL_TABLET | Freq: Four times a day (QID) | ORAL | Status: DC | PRN

## 2016-09-30 MED ORDER — ACETAMINOPHEN 325 MG PO TABS
650.0000 mg | ORAL_TABLET | Freq: Four times a day (QID) | ORAL | Status: DC | PRN
  Administered 2016-10-01 – 2016-10-03 (×3): 650 mg via ORAL
  Filled 2016-09-30 (×4): qty 2

## 2016-09-30 MED ORDER — ACETAMINOPHEN 650 MG RE SUPP: 650.0000 mg | Freq: Four times a day (QID) | RECTAL | Status: DC | PRN

## 2016-09-30 MED ORDER — METHOCARBAMOL 500 MG PO TABS: 500.0000 mg | ORAL_TABLET | Freq: Four times a day (QID) | ORAL | 1 refills | Status: AC | PRN

## 2016-09-30 MED ORDER — 0.9 % SODIUM CHLORIDE (POUR BTL) OPTIME
TOPICAL | Status: DC | PRN
  Administered 2016-09-30: 1000 mL

## 2016-09-30 MED ORDER — ACETAMINOPHEN 10 MG/ML IV SOLN
1000.0000 mg | Freq: Four times a day (QID) | INTRAVENOUS | Status: DC
  Administered 2016-09-30: 1000 mg via INTRAVENOUS
  Filled 2016-09-30 (×3): qty 100

## 2016-09-30 MED ORDER — ENOXAPARIN (LOVENOX) PATIENT EDUCATION KIT
PACK | Freq: Once | Status: DC
  Filled 2016-09-30: qty 1

## 2016-09-30 MED ORDER — CEFAZOLIN SODIUM-DEXTROSE 1-4 GM/50ML-% IV SOLN
1.0000 g | Freq: Four times a day (QID) | INTRAVENOUS | Status: AC
  Administered 2016-09-30 – 2016-10-01 (×3): 1 g via INTRAVENOUS
  Filled 2016-09-30 (×3): qty 50

## 2016-09-30 MED ORDER — MIDAZOLAM HCL 5 MG/5ML IJ SOLN
INTRAMUSCULAR | Status: DC | PRN
  Administered 2016-09-30: 2 mg via INTRAVENOUS

## 2016-09-30 MED ORDER — HYDROMORPHONE HCL 1 MG/ML IJ SOLN
INTRAMUSCULAR | Status: AC
  Filled 2016-09-30: qty 1

## 2016-09-30 MED ORDER — METOCLOPRAMIDE HCL 5 MG PO TABS: 5.0000 mg | ORAL_TABLET | Freq: Three times a day (TID) | ORAL | Status: DC | PRN

## 2016-09-30 MED ORDER — OXYCODONE HCL 5 MG PO TABS: 5.0000 mg | ORAL_TABLET | ORAL | Status: DC | PRN

## 2016-09-30 MED ORDER — LIDOCAINE 2% (20 MG/ML) 5 ML SYRINGE
INTRAMUSCULAR | Status: DC | PRN
  Administered 2016-09-30: 60 mg via INTRAVENOUS

## 2016-09-30 MED ORDER — ENOXAPARIN SODIUM 40 MG/0.4ML ~~LOC~~ SOLN
40.0000 mg | SUBCUTANEOUS | Status: DC
  Administered 2016-10-01 – 2016-10-03 (×3): 40 mg via SUBCUTANEOUS
  Filled 2016-09-30 (×3): qty 0.4

## 2016-09-30 MED ORDER — HYDROMORPHONE HCL 1 MG/ML IJ SOLN
1.0000 mg | INTRAMUSCULAR | Status: DC | PRN
  Filled 2016-09-30: qty 2

## 2016-09-30 SURGICAL SUPPLY — 62 items
BANDAGE ACE 4X5 VEL STRL LF (GAUZE/BANDAGES/DRESSINGS) ×4 IMPLANT
BANDAGE ACE 6X5 VEL STRL LF (GAUZE/BANDAGES/DRESSINGS) ×4 IMPLANT
BIT DRILL 2.5 X LONG (BIT) ×2
BIT DRILL 4.2 (DRILL) ×2 IMPLANT
BIT DRILL FLUTED 3.2X330 LONG (BIT) ×4 IMPLANT
BIT DRILL FLUTED FEMUR 4.2/3 (BIT) ×4 IMPLANT
BIT DRILL QC 3.5X110 (BIT) ×4 IMPLANT
BIT DRILL X LONG 2.5 (BIT) ×2 IMPLANT
BLADE SURG 10 STRL SS (BLADE) ×8 IMPLANT
BNDG ELASTIC 2 VLCR STRL LF (GAUZE/BANDAGES/DRESSINGS) ×4 IMPLANT
BNDG GAUZE ELAST 4 BULKY (GAUZE/BANDAGES/DRESSINGS) ×4 IMPLANT
BRUSH SCRUB SURG 4.25 DISP (MISCELLANEOUS) ×8 IMPLANT
CLOSURE WOUND 1/2 X4 (GAUZE/BANDAGES/DRESSINGS) ×1
COVER SURGICAL LIGHT HANDLE (MISCELLANEOUS) ×8 IMPLANT
DRAPE C-ARM 42X72 X-RAY (DRAPES) ×4 IMPLANT
DRAPE C-ARMOR (DRAPES) ×4 IMPLANT
DRAPE HALF SHEET 40X57 (DRAPES) ×8 IMPLANT
DRAPE INCISE IOBAN 66X45 STRL (DRAPES) IMPLANT
DRAPE U-SHAPE 47X51 STRL (DRAPES) ×4 IMPLANT
DRILL 4.2 (DRILL) ×4
DRILL BIT X LONG 2.5 (BIT) ×2
DRSG ADAPTIC 3X8 NADH LF (GAUZE/BANDAGES/DRESSINGS) ×4 IMPLANT
ELECT REM PT RETURN 9FT ADLT (ELECTROSURGICAL) ×4
ELECTRODE REM PT RTRN 9FT ADLT (ELECTROSURGICAL) ×2 IMPLANT
GAUZE SPONGE 4X4 12PLY STRL (GAUZE/BANDAGES/DRESSINGS) ×4 IMPLANT
GAUZE XEROFORM 5X9 LF (GAUZE/BANDAGES/DRESSINGS) ×4 IMPLANT
GLOVE BIO SURGEON STRL SZ7.5 (GLOVE) ×4 IMPLANT
GLOVE BIO SURGEON STRL SZ8 (GLOVE) ×4 IMPLANT
GLOVE BIO SURGEON STRL SZ8.5 (GLOVE) ×4 IMPLANT
GLOVE BIOGEL PI IND STRL 7.5 (GLOVE) ×2 IMPLANT
GLOVE BIOGEL PI IND STRL 8 (GLOVE) ×2 IMPLANT
GLOVE BIOGEL PI INDICATOR 7.5 (GLOVE) ×2
GLOVE BIOGEL PI INDICATOR 8 (GLOVE) ×2
GOWN STRL REUS W/ TWL LRG LVL3 (GOWN DISPOSABLE) ×4 IMPLANT
GOWN STRL REUS W/ TWL XL LVL3 (GOWN DISPOSABLE) ×2 IMPLANT
GOWN STRL REUS W/TWL LRG LVL3 (GOWN DISPOSABLE) ×4
GOWN STRL REUS W/TWL XL LVL3 (GOWN DISPOSABLE) ×2
GUIDEWIRE 3.2X400 (WIRE) ×4 IMPLANT
KIT BASIN OR (CUSTOM PROCEDURE TRAY) ×4 IMPLANT
KIT ROOM TURNOVER OR (KITS) ×4 IMPLANT
NAIL TIB 10X390 (Nail) ×4 IMPLANT
PACK ORTHO EXTREMITY (CUSTOM PROCEDURE TRAY) ×4 IMPLANT
PAD ABD 8X10 STRL (GAUZE/BANDAGES/DRESSINGS) ×4 IMPLANT
PAD ARMBOARD 7.5X6 YLW CONV (MISCELLANEOUS) ×8 IMPLANT
REAMER ROD DEEP FLUTE 2.5X950 (INSTRUMENTS) ×4 IMPLANT
SCREW CORTEX 3.5 36MM (Screw) ×2 IMPLANT
SCREW CORTEX 3.5 50MM (Screw) ×4 IMPLANT
SCREW DUAL CORE LOCKING 5.0X70 (Screw) ×4 IMPLANT
SCREW DUAL CORE LOCKING 55MM (Screw) ×4 IMPLANT
SCREW LOCK CORT ST 3.5X36 (Screw) ×2 IMPLANT
SCREW LOCK STAR 5X46 (Screw) ×4 IMPLANT
SCREW LOCK STAR 5X48 (Screw) ×4 IMPLANT
STAPLER VISISTAT 35W (STAPLE) ×4 IMPLANT
STRIP CLOSURE SKIN 1/2X4 (GAUZE/BANDAGES/DRESSINGS) ×3 IMPLANT
SUT ETHILON 3 0 PS 1 (SUTURE) ×4 IMPLANT
SUT VIC AB 0 CT1 27 (SUTURE)
SUT VIC AB 0 CT1 27XBRD ANBCTR (SUTURE) IMPLANT
SUT VIC AB 2-0 CT1 27 (SUTURE)
SUT VIC AB 2-0 CT1 TAPERPNT 27 (SUTURE) IMPLANT
TOWEL OR 17X24 6PK STRL BLUE (TOWEL DISPOSABLE) ×4 IMPLANT
TOWEL OR 17X26 10 PK STRL BLUE (TOWEL DISPOSABLE) ×8 IMPLANT
YANKAUER SUCT BULB TIP NO VENT (SUCTIONS) IMPLANT
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
## 2016-09-30 NOTE — ED Notes (Signed)
Long leg splint left Xavier Dorsey

## 2016-09-30 NOTE — Discharge Instructions (Signed)
[facility] Discharge Instructions   General Discharge Instructions  WEIGHT BEARING STATUS: nonweightbearing left leg  RANGE OF MOTION/ACTIVITY: unrestricted range of motion left hip and knee  Wound Care: do not remove dressing. We will remove at first postoperative follow up visit. Keep splint clean and dry   DVT/PE prophylaxis: lovenox x 21 days   Diet: as you were eating previously.  Can use over the counter stool softeners and bowel preparations, such as Miralax, to help with bowel movements.  Narcotics can be constipating.  Be sure to drink plenty of fluids  PAIN MEDICATION USE AND EXPECTATIONS  You have likely been given narcotic medications to help control your pain.  After a traumatic event that results in an fracture (broken bone) with or without surgery, it is ok to use narcotic pain medications to help control one's pain.  We understand that everyone responds to pain differently and each individual patient will be evaluated on a regular basis for the continued need for narcotic medications. Ideally, narcotic medication use should last no more than 6-8 weeks (coinciding with fracture healing).   As a patient it is your responsibility as well to monitor narcotic medication use and report the amount and frequency you use these medications when you come to your office visit.   We would also advise that if you are using narcotic medications, you should take a dose prior to therapy to maximize you participation.  IF YOU ARE ON NARCOTIC MEDICATIONS IT IS NOT PERMISSIBLE TO OPERATE A MOTOR VEHICLE (MOTORCYCLE/CAR/TRUCK/MOPED) OR HEAVY MACHINERY DO NOT MIX NARCOTICS WITH OTHER CNS (CENTRAL NERVOUS SYSTEM) DEPRESSANTS SUCH AS ALCOHOL   STOP SMOKING OR USING NICOTINE PRODUCTS!!!!  As discussed nicotine severely impairs your body's ability to heal surgical and traumatic wounds but also impairs bone healing.  Wounds and bone heal by forming microscopic blood vessels  (angiogenesis) and nicotine is a vasoconstrictor (essentially, shrinks blood vessels).  Therefore, if vasoconstriction occurs to these microscopic blood vessels they essentially disappear and are unable to deliver necessary nutrients to the healing tissue.  This is one modifiable factor that you can do to dramatically increase your chances of healing your injury.    (This means no smoking, no nicotine gum, patches, etc)  DO NOT USE NONSTEROIDAL ANTI-INFLAMMATORY DRUGS (NSAID'S)  Using products such as Advil (ibuprofen), Aleve (naproxen), Motrin (ibuprofen) for additional pain control during fracture healing can delay and/or prevent the healing response.  If you would like to take over the counter (OTC) medication, Tylenol (acetaminophen) is ok.  However, some narcotic medications that are given for pain control contain acetaminophen as well. Therefore, you should not exceed more than 4000 mg of tylenol in a day if you do not have liver disease.  Also note that there are may OTC medicines, such as cold medicines and allergy medicines that my contain tylenol as well.  If you have any questions about medications and/or interactions please ask your doctor/PA or your pharmacist.      ICE AND ELEVATE INJURED/OPERATIVE EXTREMITY  Using ice and elevating the injured extremity above your heart can help with swelling and pain control.  Icing in a pulsatile fashion, such as 20 minutes on and 20 minutes off, can be followed.    Do not place ice directly on skin. Make sure there is a barrier between to skin and the ice pack.    Using frozen items such as frozen peas works well as the conform nicely to the are that needs to be  iced.  USE AN ACE WRAP OR TED HOSE FOR SWELLING CONTROL  In addition to icing and elevation, Ace wraps or TED hose are used to help limit and resolve swelling.  It is recommended to use Ace wraps or TED hose until you are informed to stop.    When using Ace Wraps start the wrapping distally  (farthest away from the body) and wrap proximally (closer to the body)   Example: If you had surgery on your leg or thing and you do not have a splint on, start the ace wrap at the toes and work your way up to the thigh        If you had surgery on your upper extremity and do not have a splint on, start the ace wrap at your fingers and work your way up to the upper arm  IF YOU ARE IN A SPLINT OR CAST DO NOT REMOVE IT FOR ANY REASON   If your splint gets wet for any reason please contact the office immediately. You may shower in your splint or cast as long as you keep it dry.  This can be done by wrapping in a cast cover or garbage back (or similar)  Do Not stick any thing down your splint or cast such as pencils, money, or hangers to try and scratch yourself with.  If you feel itchy take benadryl as prescribed on the bottle for itching  IF YOU ARE IN A CAM BOOT (BLACK BOOT)  You may remove boot periodically. Perform daily dressing changes as noted below.  Wash the liner of the boot regularly and wear a sock when wearing the boot. It is recommended that you sleep in the boot until told otherwise  CALL THE OFFICE WITH ANY QUESTIONS OR CONCERNS: [phone]        Discharge Pin Site Instructions  Dress pins daily with Kerlix roll starting on POD 2. Wrap the Kerlix so that it tamps the skin down around the pin-skin interface to prevent/limit motion of the skin relative to the pin.  (Pin-skin motion is the primary cause of pain and infection related to external fixator pin sites).  Remove any crust or coagulum that may obstruct drainage with a saline moistened gauze or soap and water.  After POD 3, if there is no discernable drainage on the pin site dressing, the interval for change can by increased to every other day.  You may shower with the fixator, cleaning all pin sites gently with soap and water.  If you have a surgical wound this needs to be completely dry and without drainage before  showering.  The extremity can be lifted by the fixator to facilitate wound care and transfers.  Notify the office/Doctor if you experience increasing drainage, redness, or pain from a pin site, or if you notice purulent (thick, snot-like) drainage.  Discharge Wound Care Instructions  Do NOT apply any ointments, solutions or lotions to pin sites or surgical wounds.  These prevent needed drainage and even though solutions like hydrogen peroxide kill bacteria, they also damage cells lining the pin sites that help fight infection.  Applying lotions or ointments can keep the wounds moist and can cause them to breakdown and open up as well. This can increase the risk for infection. When in doubt call the office.  Surgical incisions should be dressed daily.  If any drainage is noted, use one layer of adaptic, then gauze, Kerlix, and an ace wrap.  Once the incision is completely dry  and without drainage, it may be left open to air out.  Showering may begin 36-48 hours later.  Cleaning gently with soap and water.  Traumatic wounds should be dressed daily as well.    One layer of adaptic, gauze, Kerlix, then ace wrap.  The adaptic can be discontinued once the draining has ceased    If you have a wet to dry dressing: wet the gauze with saline the squeeze as much saline out so the gauze is moist (not soaking wet), place moistened gauze over wound, then place a dry gauze over the moist one, followed by Kerlix wrap, then ace wrap.

## 2016-09-30 NOTE — ED Notes (Signed)
Pharmacy notified x 3 for medications.
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
## 2016-09-30 NOTE — Anesthesia Procedure Notes (Signed)
Procedure Name: Intubation Date/Time: 09/30/2016 7:10 PM Performed by: Myna Bright Pre-anesthesia Checklist: Patient identified, Emergency Drugs available, Suction available and Patient being monitored Patient Re-evaluated:Patient Re-evaluated prior to induction Oxygen Delivery Method: Circle system utilized Preoxygenation: Pre-oxygenation with 100% oxygen Induction Type: IV induction Ventilation: Mask ventilation without difficulty Laryngoscope Size: Mac and 4 Grade View: Grade I Tube type: Oral Tube size: 7.5 mm Number of attempts: 1 Airway Equipment and Method: Stylet Placement Confirmation: ETT inserted through vocal cords under direct vision,  positive ETCO2 and breath sounds checked- equal and bilateral Secured at: 22 cm Tube secured with: Tape Dental Injury: Teeth and Oropharynx as per pre-operative assessment

## 2016-09-30 NOTE — Progress Notes (Signed)
Orthopedic Tech Progress Note Patient Details:  Xavier Dorsey [birth year] [id number]  Ortho Devices Type of Ortho Device: Long leg splint, Stirrup splint, Ace wrap Ortho Device/Splint Interventions: Application   Saul Fordyce 09/30/2016, 12:39 PM

## 2016-09-30 NOTE — ED Notes (Signed)
Pt made aware, waiting for ortho consult.
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
## 2016-09-30 NOTE — ED Notes (Addendum)
Ortho applied splint. Patient transported to X-ray

## 2016-09-30 NOTE — ED Notes (Signed)
Ortho at bedside.

## 2016-09-30 NOTE — Transfer of Care (Signed)
Immediate Anesthesia Transfer of Care Note  Patient: Xavier Dorsey  Procedure(s) Performed: Procedure(s): INTRAMEDULLARY (IM) NAIL TIBIAL (Left) OPEN REDUCTION INTERNAL FIXATION (ORIF) ANKLE FRACTURE (Left)  Patient Location: PACU  Anesthesia Type:General  Level of Consciousness: awake, alert , oriented and patient cooperative  Airway & Oxygen Therapy: Patient Spontanous Breathing and Patient connected to nasal cannula oxygen  Post-op Assessment: Report given to RN, Post -op Vital signs reviewed and stable and Patient moving all extremities  Post vital signs: Reviewed and stable  Last Vitals:  Vitals:   09/30/16 1446 09/30/16 1645  BP:  120/68  Pulse:  93  Resp:  18  Temp: 37.6 C 36.9 C  SpO2:  97%    Last Pain:  Vitals:   09/30/16 1645  TempSrc: Oral  PainSc:          Complications: No apparent anesthesia complications

## 2016-09-30 NOTE — Anesthesia Postprocedure Evaluation (Signed)
Anesthesia Post Note  Patient: Xavier Dorsey  Procedure(s) Performed: Procedure(s) (LRB): INTRAMEDULLARY (IM) NAIL TIBIAL (Left) OPEN REDUCTION INTERNAL FIXATION (ORIF) ANKLE FRACTURE (Left)     Patient location during evaluation: PACU Anesthesia Type: General Level of consciousness: awake and alert Pain management: pain level controlled Vital Signs Assessment: post-procedure vital signs reviewed and stable Respiratory status: spontaneous breathing, nonlabored ventilation, respiratory function stable and patient connected to nasal cannula oxygen Cardiovascular status: blood pressure returned to baseline and stable Postop Assessment: no signs of nausea or vomiting Anesthetic complications: no    Last Vitals:  Vitals:   09/30/16 2215 09/30/16 2230  BP: 130/83 (!) 142/85  Pulse: 88 97  Resp: 16 15  Temp:    SpO2: 100% 100%    Last Pain:  Vitals:   09/30/16 2230  TempSrc:   PainSc: 6                  Kennieth Rad

## 2016-09-30 NOTE — ED Notes (Signed)
Attempted to call report

## 2016-09-30 NOTE — Anesthesia Preprocedure Evaluation (Signed)
Anesthesia Evaluation  Patient identified by MRN, date of birth, ID band Patient awake    Reviewed: Allergy & Precautions, NPO status , Patient's Chart, lab work & pertinent test results  Airway Mallampati: II  TM Distance: >3 FB Neck ROM: Full    Dental  (+) Dental Advisory Given   Pulmonary neg pulmonary ROS,    breath sounds clear to auscultation       Cardiovascular negative cardio ROS   Rhythm:Regular Rate:Normal     Neuro/Psych concussion    GI/Hepatic negative GI ROS, Neg liver ROS,   Endo/Other  negative endocrine ROS  Renal/GU negative Renal ROS     Musculoskeletal   Abdominal   Peds  Hematology negative hematology ROS (+)   Anesthesia Other Findings   Reproductive/Obstetrics                             Lab Results  Component Value Date   WBC 7.3 09/30/2016   HGB 14.3 09/30/2016   HCT 42.0 09/30/2016   MCV 84.4 09/30/2016   PLT 163 09/30/2016   Lab Results  Component Value Date   CREATININE 0.90 09/30/2016   BUN 7 09/30/2016   NA 140 09/30/2016   K 4.5 09/30/2016   CL 103 09/30/2016   CO2 26 09/30/2016    Anesthesia Physical Anesthesia Plan  ASA: II  Anesthesia Plan: General   Post-op Pain Management:    Induction: Intravenous  PONV Risk Score and Plan: 2 and Ondansetron and Dexamethasone  Airway Management Planned: Oral ETT  Additional Equipment:   Intra-op Plan:   Post-operative Plan: Extubation in OR  Informed Consent: I have reviewed the patients History and Physical, chart, labs and discussed the procedure including the risks, benefits and alternatives for the proposed anesthesia with the patient or authorized representative who has indicated his/her understanding and acceptance.   Dental advisory given  Plan Discussed with: CRNA  Anesthesia Plan Comments:         Anesthesia Quick Evaluation

## 2016-09-30 NOTE — ED Notes (Signed)
Pt is sleeping, cms intact to both feet, pedal pulses 3+ bilaterally

## 2016-09-30 NOTE — ED Notes (Signed)
Ortho paged. 
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
## 2016-10-01 LAB — RAPID URINE DRUG SCREEN, HOSP PERFORMED
Amphetamines: NOT DETECTED
BARBITURATES: NOT DETECTED
Benzodiazepines: NOT DETECTED
COCAINE: NOT DETECTED
Opiates: NOT DETECTED
Tetrahydrocannabinol: NOT DETECTED

## 2016-10-01 LAB — BASIC METABOLIC PANEL
ANION GAP: 6 (ref 5–15)
BUN: 9 mg/dL (ref 6–20)
CO2: 27 mmol/L (ref 22–32)
CREATININE: 0.67 mg/dL (ref 0.61–1.24)
Calcium: 8 mg/dL — ABNORMAL LOW (ref 8.9–10.3)
Chloride: 104 mmol/L (ref 101–111)
GLUCOSE: 109 mg/dL — AB (ref 65–99)
POTASSIUM: 3.9 mmol/L (ref 3.5–5.1)
SODIUM: 137 mmol/L (ref 135–145)

## 2016-10-01 LAB — CBC
HEMATOCRIT: 34.8 % — AB (ref 39.0–52.0)
Hemoglobin: 11.5 g/dL — ABNORMAL LOW (ref 13.0–17.0)
MCH: 28.9 pg (ref 26.0–34.0)
MCHC: 33 g/dL (ref 30.0–36.0)
MCV: 87.4 fL (ref 78.0–100.0)
Platelets: 128 10*3/uL — ABNORMAL LOW (ref 150–400)
RBC: 3.98 MIL/uL — ABNORMAL LOW (ref 4.22–5.81)
RDW: 13.2 % (ref 11.5–15.5)
WBC: 5.3 10*3/uL (ref 4.0–10.5)

## 2016-10-01 LAB — CREATININE, SERUM
Creatinine, Ser: 0.67 mg/dL (ref 0.61–1.24)
GFR calc Af Amer: >60 mL/min (ref >60)
GFR calc non Af Amer: >60 mL/min (ref >60)
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
## 2016-10-01 NOTE — Progress Notes (Signed)
Subjective: 1 Day Post-Op Procedure(s) (LRB): INTRAMEDULLARY (IM) NAIL TIBIAL (Left) OPEN REDUCTION INTERNAL FIXATION (ORIF) ANKLE FRACTURE (Left) Patient reports pain as 5 on 0-10 scale.    Objective: Vital signs in last 24 hours: Temp:  [98 F (36.7 C)-99.8 F (37.7 C)] 98 F (36.7 C) (08/18 0810) Pulse Rate:  [75-106] 75 (08/18 0810) Resp:  [14-19] 16 (08/18 0810) BP: (106-146)/(51-95) 126/67 (08/18 0810) SpO2:  [95 %-100 %] 97 % (08/18 0810)  Intake/Output from previous day: 08/17 0701 - 08/18 0700 In: 1773.3 [I.V.:1723.3; IV Piggyback:50] Out: 475 [Urine:400; Blood:75] Intake/Output this shift: No intake/output data recorded.   Recent Labs  09/30/16 0645 09/30/16 0650 09/30/16 2313 10/01/16 0235  HGB 14.2 14.3 11.9* 11.5*    Recent Labs  09/30/16 2313 10/01/16 0235  WBC 6.6 5.3  RBC 4.19* 3.98*  HCT 35.8* 34.8*  PLT 141* 128*    Recent Labs  09/30/16 0645 09/30/16 0650 09/30/16 2313 10/01/16 0235  NA 137 140  --  137  K 4.3 4.5  --  3.9  CL 105 103  --  104  CO2 26  --   --  27  BUN 6 7  --  9  CREATININE 0.74 0.90 0.67 0.67  GLUCOSE 112* 112*  --  109*  CALCIUM 8.2*  --   --  8.0*    Recent Labs  09/30/16 0645 09/30/16 1045  INR 1.05 1.07    Neurovascular intact Sensation intact distally Intact pulses distally Dorsiflexion/Plantar flexion intact Incision: significant drainage  Assessment/Plan: 1 Day Post-Op Procedure(s) (LRB): INTRAMEDULLARY (IM) NAIL TIBIAL (Left) OPEN REDUCTION INTERNAL FIXATION (ORIF) ANKLE FRACTURE (Left) Advance diet Up with therapy Plan for discharge tomorrow Discharge home with home health  Pascal Lux 10/01/2016, 8:42 AM

## 2016-10-02 ENCOUNTER — Encounter (HOSPITAL_COMMUNITY): Payer: Self-pay | Admitting: *Deleted

## 2016-10-02 LAB — BASIC METABOLIC PANEL
Anion gap: 8 (ref 5–15)
BUN: 5 mg/dL — ABNORMAL LOW (ref 6–20)
CALCIUM: 7.9 mg/dL — AB (ref 8.9–10.3)
CO2: 24 mmol/L (ref 22–32)
CREATININE: 0.51 mg/dL — AB (ref 0.61–1.24)
Chloride: 103 mmol/L (ref 101–111)
GFR calc non Af Amer: >60 mL/min (ref >60)
Glucose, Bld: 111 mg/dL — ABNORMAL HIGH (ref 65–99)
Potassium: 3.6 mmol/L (ref 3.5–5.1)
SODIUM: 135 mmol/L (ref 135–145)

## 2016-10-02 MED ORDER — CEFAZOLIN SODIUM-DEXTROSE 2-4 GM/100ML-% IV SOLN
2.0000 g | Freq: Three times a day (TID) | INTRAVENOUS | Status: DC
  Administered 2016-10-02 – 2016-10-03 (×3): 2 g via INTRAVENOUS
  Filled 2016-10-02 (×4): qty 100
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
## 2016-10-02 NOTE — Progress Notes (Signed)
Orthopedic Tech Progress Note Patient Details:  Xavier Dorsey 1990/05/06 [id number]  Ortho Devices Type of Ortho Device: Crutches Ortho Device/Splint Location: lle Ortho Device/Splint Interventions: Application   Nikki Dom 10/02/2016, 10:39 AM Viewed order from doctor's order list

## 2016-10-02 NOTE — Progress Notes (Signed)
Po Tylenol given
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
## 2016-10-02 NOTE — Progress Notes (Signed)
Subjective: 2 Days Post-Op Procedure(s) (LRB): INTRAMEDULLARY (IM) NAIL TIBIAL (Left) OPEN REDUCTION INTERNAL FIXATION (ORIF) ANKLE FRACTURE (Left) Patient reports pain as 4 on 0-10 scale.    Objective: Vital signs in last 24 hours: Temp:  [99.2 F (37.3 C)-100.5 F (38.1 C)] 100.5 F (38.1 C) (08/19 0610) Pulse Rate:  [89-103] 94 (08/19 0610) Resp:  [16] 16 (08/18 1216) BP: (115-133)/(61-76) 133/76 (08/19 0610) SpO2:  [97 %-100 %] 100 % (08/19 0610)  Intake/Output from previous day: 08/18 0701 - 08/19 0700 In: 1240 [P.O.:240; I.V.:1000] Out: 100 [Urine:100] Intake/Output this shift: No intake/output data recorded.   Recent Labs  09/30/16 0645 09/30/16 0650 09/30/16 2313 10/01/16 0235  HGB 14.2 14.3 11.9* 11.5*    Recent Labs  09/30/16 2313 10/01/16 0235  WBC 6.6 5.3  RBC 4.19* 3.98*  HCT 35.8* 34.8*  PLT 141* 128*    Recent Labs  10/01/16 0235 10/02/16 0432  NA 137 135  K 3.9 3.6  CL 104 103  CO2 27 24  BUN 9 5*  CREATININE 0.67 0.51*  GLUCOSE 109* 111*  CALCIUM 8.0* 7.9*    Recent Labs  09/30/16 0645 09/30/16 1045  INR 1.05 1.07    Neurovascular intact Sensation intact distally Intact pulses distally Dorsiflexion/Plantar flexion intact Incision: dressing C/D/I  Assessment/Plan: 2 Days Post-Op Procedure(s) (LRB): INTRAMEDULLARY (IM) NAIL TIBIAL (Left) OPEN REDUCTION INTERNAL FIXATION (ORIF) ANKLE FRACTURE (Left) Advance diet Up with therapy Plan for discharge tomorrow   Discharge today cancelled due to fever over 100 times two separate episodes   Restarted Ancef.  Ordered CBC in AM   Reordered incentive spirometry  Nakai Yard J 10/02/2016, 11:07 AM

## 2016-10-02 NOTE — Care Management Note (Signed)
Case Management Note  Patient Details  Name: Xavier Dorsey MRN: [id number] Date of Birth: December 01, 1990  Subjective/Objective:  26 y.o. 2d post op IM nail Tibial ORIF ankle Fx. Developed fever yesterday which delayed discharge. 3n1 delivered.                  Action/Plan: [facility] delivered DME. CM will sign off for now but will be available should additional discharge needs arise or disposition change.    Expected Discharge Date:  10/02/16               Expected Discharge Plan:     In-House Referral:     Discharge planning Services  CM Consult  Post Acute Care Choice:  Durable Medical Equipment Choice offered to:     DME Arranged:  3-N-1, Crutches (Crutches will be delivered by Ortho Dept) DME Agency:  [facility]  HH Arranged:    HH Agency:     Status of Service:  Completed, signed off  If discussed at Long Length of Stay Meetings, dates discussed:    Additional Comments:  Yvone Neu, RN 10/02/2016, 11:15 AM
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
## 2016-10-02 NOTE — Progress Notes (Signed)
Orthopedic Tech Progress Note Patient Details:  Acie Alavi [birth year] [id number]  Ortho Devices Type of Ortho Device: Ace wrap, Post (short leg) splint, Stirrup splint Ortho Device/Splint Location: lle Ortho Device/Splint Interventions: Application   Krisann Mckenna 10/02/2016, 9:35 AM As ordered by PA Tomasa Rand

## 2016-10-03 ENCOUNTER — Encounter (HOSPITAL_COMMUNITY): Payer: Self-pay | Admitting: Orthopedic Surgery

## 2016-10-03 LAB — CBC WITH DIFFERENTIAL/PLATELET
BASOS PCT: 0 %
Basophils Absolute: 0 10*3/uL (ref 0.0–0.1)
EOS ABS: 0.1 10*3/uL (ref 0.0–0.7)
EOS PCT: 2 %
HEMATOCRIT: 28.5 % — AB (ref 39.0–52.0)
Hemoglobin: 9.5 g/dL — ABNORMAL LOW (ref 13.0–17.0)
Lymphocytes Relative: 42 %
Lymphs Abs: 1.4 10*3/uL (ref 0.7–4.0)
MCH: 29 pg (ref 26.0–34.0)
MCHC: 33.3 g/dL (ref 30.0–36.0)
MCV: 86.9 fL (ref 78.0–100.0)
MONO ABS: 0.5 10*3/uL (ref 0.1–1.0)
Monocytes Relative: 13 %
Neutro Abs: 1.4 10*3/uL — ABNORMAL LOW (ref 1.7–7.7)
Neutrophils Relative %: 43 %
PLATELETS: 122 10*3/uL — AB (ref 150–400)
RBC: 3.28 MIL/uL — ABNORMAL LOW (ref 4.22–5.81)
RDW: 12.9 % (ref 11.5–15.5)
WBC: 3.4 10*3/uL — ABNORMAL LOW (ref 4.0–10.5)

## 2016-10-03 LAB — BASIC METABOLIC PANEL
Anion gap: 6 (ref 5–15)
BUN: 8 mg/dL (ref 6–20)
CALCIUM: 7.9 mg/dL — AB (ref 8.9–10.3)
CO2: 27 mmol/L (ref 22–32)
CREATININE: 0.54 mg/dL — AB (ref 0.61–1.24)
Chloride: 101 mmol/L (ref 101–111)
GFR calc Af Amer: >60 mL/min (ref >60)
GLUCOSE: 110 mg/dL — AB (ref 65–99)
Potassium: 3.4 mmol/L — ABNORMAL LOW (ref 3.5–5.1)
Sodium: 134 mmol/L — ABNORMAL LOW (ref 135–145)
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
## 2016-10-04 LAB — HIV 1/2 AB DIFFERENTIATION
HIV 1 Ab: POSITIVE — AB
HIV 2 Ab: NEGATIVE

## 2016-10-04 LAB — HIV ANTIBODY (ROUTINE TESTING W REFLEX): HIV Screen 4th Generation wRfx: REACTIVE — AB

## 2016-10-13 ENCOUNTER — Ambulatory Visit (HOSPITAL_COMMUNITY)
Admission: RE | Admit: 2016-10-13 | Discharge: 2016-10-13 | Disposition: A | Discharge status: Home / Self Care | Payer: 59 | Source: Ambulatory Visit | Attending: Vascular Surgery | Admitting: Vascular Surgery

## 2016-10-13 ENCOUNTER — Other Ambulatory Visit (HOSPITAL_COMMUNITY): Payer: Self-pay | Admitting: Orthopedic Surgery

## 2016-10-13 DIAGNOSIS — I82442 Acute embolism and thrombosis of left tibial vein: Secondary | ICD-10-CM | POA: Insufficient documentation

## 2016-10-13 DIAGNOSIS — I82412 Acute embolism and thrombosis of left femoral vein: Secondary | ICD-10-CM | POA: Diagnosis not present
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
## 2016-11-07 ENCOUNTER — Encounter: Payer: Self-pay | Admitting: Physical Therapy

## 2016-11-07 ENCOUNTER — Ambulatory Visit: Payer: 59 | Attending: Orthopedic Surgery | Admitting: Physical Therapy

## 2016-11-07 DIAGNOSIS — R2242 Localized swelling, mass and lump, left lower limb: Secondary | ICD-10-CM

## 2016-11-07 DIAGNOSIS — R262 Difficulty in walking, not elsewhere classified: Secondary | ICD-10-CM

## 2016-11-07 DIAGNOSIS — M25672 Stiffness of left ankle, not elsewhere classified: Secondary | ICD-10-CM

## 2016-11-07 DIAGNOSIS — M25572 Pain in left ankle and joints of left foot: Secondary | ICD-10-CM | POA: Diagnosis not present
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
## 2016-11-09 ENCOUNTER — Encounter: Payer: Self-pay | Admitting: Physical Therapy

## 2016-11-09 ENCOUNTER — Ambulatory Visit: Payer: 59 | Admitting: Physical Therapy

## 2016-11-09 DIAGNOSIS — M25572 Pain in left ankle and joints of left foot: Secondary | ICD-10-CM

## 2016-11-09 DIAGNOSIS — M25672 Stiffness of left ankle, not elsewhere classified: Secondary | ICD-10-CM

## 2016-11-09 DIAGNOSIS — R262 Difficulty in walking, not elsewhere classified: Secondary | ICD-10-CM
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
## 2016-11-15 ENCOUNTER — Encounter: Payer: Self-pay | Admitting: Physical Therapy

## 2016-11-15 ENCOUNTER — Ambulatory Visit: Payer: 59 | Attending: Orthopedic Surgery | Admitting: Physical Therapy

## 2016-11-15 DIAGNOSIS — M25672 Stiffness of left ankle, not elsewhere classified: Secondary | ICD-10-CM | POA: Insufficient documentation

## 2016-11-15 DIAGNOSIS — R262 Difficulty in walking, not elsewhere classified: Secondary | ICD-10-CM | POA: Insufficient documentation

## 2016-11-15 DIAGNOSIS — M25572 Pain in left ankle and joints of left foot: Secondary | ICD-10-CM | POA: Insufficient documentation

## 2016-11-15 DIAGNOSIS — R2242 Localized swelling, mass and lump, left lower limb: Secondary | ICD-10-CM
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
## 2016-11-17 ENCOUNTER — Encounter: Payer: Self-pay | Admitting: Physical Therapy

## 2016-11-17 ENCOUNTER — Ambulatory Visit: Payer: 59 | Admitting: Physical Therapy

## 2016-11-17 DIAGNOSIS — R262 Difficulty in walking, not elsewhere classified: Secondary | ICD-10-CM

## 2016-11-17 DIAGNOSIS — M25572 Pain in left ankle and joints of left foot: Secondary | ICD-10-CM

## 2016-11-17 DIAGNOSIS — M25672 Stiffness of left ankle, not elsewhere classified: Secondary | ICD-10-CM

## 2016-11-17 DIAGNOSIS — R2242 Localized swelling, mass and lump, left lower limb: Secondary | ICD-10-CM
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
## 2017-03-28 ENCOUNTER — Telehealth: Payer: Self-pay | Admitting: *Deleted

## 2017-03-28 NOTE — Telephone Encounter (Signed)
-----   Message from Ginnie SmartJeffrey C Hatcher, MD sent at 03/27/2017  4:02 PM EST ----- Can you have [facility] follow up with this fellow? thanks

## 2017-03-28 NOTE — Telephone Encounter (Signed)
Information sent to DIS per Dr Ninetta LightsHatcher

## 2017-05-15 ENCOUNTER — Encounter: Payer: Self-pay | Admitting: Physician Assistant
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
## 2023-01-13 ENCOUNTER — Other Ambulatory Visit (HOSPITAL_COMMUNITY): Payer: Self-pay

## 2023-01-13 ENCOUNTER — Telehealth: Payer: Self-pay

## 2023-01-13 NOTE — Telephone Encounter (Signed)
[facility] Patient Advocate Encounter ? ?Insurance verification completed.   ? ?The patient is uninsured and will need patient assistance for medication. ? ?We can complete the application and will need to meet with the patient for signatures and income documentation. ? ?Abraham Margulies, CPhT ?Specialty Pharmacy Patient Advocate ?[facility] ?Phone: [phone] ?Fax:  [phone]  ?
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
## 2023-01-17 ENCOUNTER — Encounter: Payer: Self-pay | Admitting: Pharmacist

## 2023-01-19 ENCOUNTER — Other Ambulatory Visit: Payer: Self-pay

## 2023-01-19 ENCOUNTER — Ambulatory Visit (INDEPENDENT_AMBULATORY_CARE_PROVIDER_SITE_OTHER): Payer: Self-pay | Admitting: Pharmacist

## 2023-01-19 DIAGNOSIS — B20 Human immunodeficiency virus [HIV] disease: Secondary | ICD-10-CM

## 2023-01-19 DIAGNOSIS — Z113 Encounter for screening for infections with a predominantly sexual mode of transmission: Secondary | ICD-10-CM

## 2023-01-19 MED ORDER — CEFTRIAXONE SODIUM 500 MG IJ SOLR
500.0000 mg | Freq: Once | INTRAMUSCULAR | Status: AC
  Administered 2023-01-19: 500 mg via INTRAMUSCULAR
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
## 2023-01-20 LAB — RPR: RPR Ser Ql: NONREACTIVE

## 2023-01-20 LAB — CT/NG RNA, TMA RECTAL
Chlamydia Trachomatis RNA: NOT DETECTED
Neisseria Gonorrhoeae RNA: NOT DETECTED

## 2023-01-20 LAB — GC/CHLAMYDIA PROBE, AMP (THROAT)
Chlamydia trachomatis RNA: NOT DETECTED
Neisseria gonorrhoeae RNA: NOT DETECTED

## 2023-01-20 LAB — C. TRACHOMATIS/N. GONORRHOEAE RNA
C. trachomatis RNA, TMA: NOT DETECTED
N. gonorrhoeae RNA, TMA: NOT DETECTED
